# Patient Record
Sex: Male | Born: 1990 | Race: White | Hispanic: No | Smoking: Current every day smoker
Health system: Southern US, Community
[De-identification: ages and names within clinical notes are randomized; demographics above are authoritative.]

## PROBLEM LIST (undated history)

## (undated) HISTORY — PX: FRACTURE SURGERY: SHX138

---
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
## 2016-04-11 ENCOUNTER — Encounter (HOSPITAL_COMMUNITY): Payer: Self-pay | Admitting: Emergency Medicine

## 2016-04-11 ENCOUNTER — Emergency Department (HOSPITAL_COMMUNITY)
Admission: EM | Admit: 2016-04-11 | Discharge: 2016-04-11 | Disposition: A | Discharge status: Home / Self Care | Payer: Medicaid Other | Attending: Emergency Medicine | Admitting: Emergency Medicine

## 2016-04-11 DIAGNOSIS — S0501XA Injury of conjunctiva and corneal abrasion without foreign body, right eye, initial encounter: Secondary | ICD-10-CM

## 2016-04-11 DIAGNOSIS — Y999 Unspecified external cause status: Secondary | ICD-10-CM | POA: Insufficient documentation

## 2016-04-11 DIAGNOSIS — X58XXXA Exposure to other specified factors, initial encounter: Secondary | ICD-10-CM | POA: Insufficient documentation

## 2016-04-11 DIAGNOSIS — H1089 Other conjunctivitis: Secondary | ICD-10-CM | POA: Insufficient documentation

## 2016-04-11 DIAGNOSIS — Y929 Unspecified place or not applicable: Secondary | ICD-10-CM | POA: Insufficient documentation

## 2016-04-11 DIAGNOSIS — H109 Unspecified conjunctivitis: Secondary | ICD-10-CM

## 2016-04-11 DIAGNOSIS — F17210 Nicotine dependence, cigarettes, uncomplicated: Secondary | ICD-10-CM | POA: Insufficient documentation

## 2016-04-11 DIAGNOSIS — Y939 Activity, unspecified: Secondary | ICD-10-CM | POA: Insufficient documentation

## 2016-04-11 MED ORDER — POLYMYXIN B-TRIMETHOPRIM 10000-0.1 UNIT/ML-% OP SOLN: 1.0000 [drp] | OPHTHALMIC | Status: DC

## 2016-04-11 MED ORDER — TETRACAINE HCL 0.5 % OP SOLN
2.0000 [drp] | Freq: Once | OPHTHALMIC | Status: AC
  Administered 2016-04-11: 2 [drp] via OPHTHALMIC
  Filled 2016-04-11: qty 2

## 2016-04-11 MED ORDER — FLUORESCEIN SODIUM 1 MG OP STRP
1.0000 | ORAL_STRIP | Freq: Once | OPHTHALMIC | Status: AC
  Administered 2016-04-11: 1 via OPHTHALMIC
  Filled 2016-04-11: qty 1

## 2016-04-11 MED ORDER — ERYTHROMYCIN 5 MG/GM OP OINT: TOPICAL_OINTMENT | OPHTHALMIC | Status: DC

## 2016-04-11 NOTE — ED Notes (Signed)
Left eye 20/30. Right eye 20/200. PT does not attempt to identify further than 20/200 with right eye. PT states, "I don't know, it's all blurry."
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
## 2016-04-11 NOTE — Discharge Instructions (Signed)
Bacterial Conjunctivitis Bacterial conjunctivitis, commonly called pink eye, is an inflammation of the clear membrane that covers the white part of the eye (conjunctiva). The inflammation can also happen on the underside of the eyelids. The blood vessels in the conjunctiva become inflamed, causing the eye to become red or pink. Bacterial conjunctivitis may spread easily from one eye to another and from person to person (contagious).  CAUSES  Bacterial conjunctivitis is caused by bacteria. The bacteria may come from your own skin, your upper respiratory tract, or from someone else with bacterial conjunctivitis. SYMPTOMS  The normally white color of the eye or the underside of the eyelid is usually pink or red. The pink eye is usually associated with irritation, tearing, and some sensitivity to light. Bacterial conjunctivitis is often associated with a thick, yellowish discharge from the eye. The discharge may turn into a crust on the eyelids overnight, which causes your eyelids to stick together. If a discharge is present, there may also be some blurred vision in the affected eye. DIAGNOSIS  Bacterial conjunctivitis is diagnosed by your caregiver through an eye exam and the symptoms that you report. Your caregiver looks for changes in the surface tissues of your eyes, which may point to the specific type of conjunctivitis. A sample of any discharge may be collected on a cotton-tip swab if you have a severe case of conjunctivitis, if your cornea is affected, or if you keep getting repeat infections that do not respond to treatment. The sample will be sent to a lab to see if the inflammation is caused by a bacterial infection and to see if the infection will respond to antibiotic medicines. TREATMENT   Bacterial conjunctivitis is treated with antibiotics. Antibiotic eyedrops are most often used. However, antibiotic ointments are also available. Antibiotics pills are sometimes used. Artificial tears or eye  washes may ease discomfort. HOME CARE INSTRUCTIONS   To ease discomfort, apply a cool, clean washcloth to your eye for 10-20 minutes, 3-4 times a day.  Gently wipe away any drainage from your eye with a warm, wet washcloth or a cotton ball.  Wash your hands often with soap and water. Use paper towels to dry your hands.  Do not share towels or washcloths. This may spread the infection.  Change or wash your pillowcase every day.  You should not use eye makeup until the infection is gone.  Do not operate machinery or drive if your vision is blurred.  Stop using contact lenses. Ask your caregiver how to sterilize or replace your contacts before using them again. This depends on the type of contact lenses that you use.  When applying medicine to the infected eye, do not touch the edge of your eyelid with the eyedrop bottle or ointment tube. SEEK IMMEDIATE MEDICAL CARE IF:   Your infection has not improved within 3 days after beginning treatment.  You had yellow discharge from your eye and it returns.  You have increased eye pain.  Your eye redness is spreading.  Your vision becomes blurred.  You have a fever or persistent symptoms for more than 2-3 days.  You have a fever and your symptoms suddenly get worse.  You have facial pain, redness, or swelling. MAKE SURE YOU:   Understand these instructions.  Will watch your condition.  Will get help right away if you are not doing well or get worse.   This information is not intended to replace advice given to you by your health care provider. Make sure you   discuss any questions you have with your health care provider.   Document Released: 09/24/2005 Document Revised: 10/15/2014 Document Reviewed: 02/25/2012 Elsevier Interactive Patient Education 2016 [employer]  Corneal Abrasion The cornea is the clear covering at the front and center of the eye. When looking at the colored portion of the eye (iris), you are looking  through the cornea. This very thin tissue is made up of many layers. The surface layer is a single layer of cells (corneal epithelium) and is one of the most sensitive tissues in the body. If a scratch or injury causes the corneal epithelium to come off, it is called a corneal abrasion. If the injury extends to the tissues below the epithelium, the condition is called a corneal ulcer. CAUSES   Scratches.  Trauma.  Foreign body in the eye. Some people have recurrences of abrasions in the area of the original injury even after it has healed (recurrent erosion syndrome). Recurrent erosion syndrome generally improves and goes away with time. SYMPTOMS   Eye pain.  Difficulty or inability to keep the injured eye open.  The eye becomes very sensitive to light.  Recurrent erosions tend to happen suddenly, first thing in the morning, usually after waking up and opening the eye. DIAGNOSIS  Your health care provider can diagnose a corneal abrasion during an eye exam. Dye is usually placed in the eye using a drop or a small paper strip moistened by your tears. When the eye is examined with a special light, the abrasion shows up clearly because of the dye. TREATMENT   Small abrasions may be treated with antibiotic drops or ointment alone.  A pressure patch may be put over the eye. If this is done, follow your doctor's instructions for when to remove the patch. Do not drive or use machines while the eye patch is on. Judging distances is hard to do with a patch on. If the abrasion becomes infected and spreads to the deeper tissues of the cornea, a corneal ulcer can result. This is serious because it can cause corneal scarring. Corneal scars interfere with light passing through the cornea and cause a loss of vision in the involved eye. HOME CARE INSTRUCTIONS  Use medicine or ointment as directed. Only take over-the-counter or prescription medicines for pain, discomfort, or fever as directed by your  health care provider.  Do not drive or operate machinery if your eye is patched. Your ability to judge distances is impaired.  If your health care provider has given you a follow-up appointment, it is very important to keep that appointment. Not keeping the appointment could result in a severe eye infection or permanent loss of vision. If there is any problem keeping the appointment, let your health care provider know. SEEK MEDICAL CARE IF:   You have pain, light sensitivity, and a scratchy feeling in one eye or both eyes.  Your pressure patch keeps loosening up, and you can blink your eye under the patch after treatment.  Any kind of discharge develops from the eye after treatment or if the lids stick together in the morning.  You have the same symptoms in the morning as you did with the original abrasion days, weeks, or months after the abrasion healed.   This information is not intended to replace advice given to you by your health care provider. Make sure you discuss any questions you have with your health care provider.   Use antibiotics eye drops and ointment as prescribed. Follow up with ophthalmology  for re-evaluation. Return to the ED if you experience severe worsening of your symptoms change in vision, redness or swelling around your eye, increased pain, fevers or chills.

## 2021-12-16 ENCOUNTER — Other Ambulatory Visit: Payer: Self-pay

## 2021-12-16 ENCOUNTER — Emergency Department (HOSPITAL_COMMUNITY): Payer: Medicaid Other

## 2021-12-16 ENCOUNTER — Emergency Department (HOSPITAL_COMMUNITY): Payer: Medicaid Other | Admitting: Anesthesiology

## 2021-12-16 ENCOUNTER — Encounter (HOSPITAL_COMMUNITY)
Admission: EM | Disposition: A | Discharge status: Home / Self Care | Payer: Self-pay | Source: Home / Self Care | Attending: Orthopaedic Surgery

## 2021-12-16 ENCOUNTER — Encounter (HOSPITAL_COMMUNITY): Payer: Self-pay | Admitting: Emergency Medicine

## 2021-12-16 ENCOUNTER — Inpatient Hospital Stay (HOSPITAL_COMMUNITY): Payer: Medicaid Other

## 2021-12-16 ENCOUNTER — Inpatient Hospital Stay (HOSPITAL_COMMUNITY)
Admission: EM | Admit: 2021-12-16 | Discharge: 2021-12-21 | DRG: 488 | Disposition: A | Discharge status: Home / Self Care | Payer: Medicaid Other | Attending: Orthopaedic Surgery | Admitting: Orthopaedic Surgery

## 2021-12-16 DIAGNOSIS — Z880 Allergy status to penicillin: Secondary | ICD-10-CM | POA: Diagnosis not present

## 2021-12-16 DIAGNOSIS — S42491A Other displaced fracture of lower end of right humerus, initial encounter for closed fracture: Secondary | ICD-10-CM | POA: Diagnosis not present

## 2021-12-16 DIAGNOSIS — S42351A Displaced comminuted fracture of shaft of humerus, right arm, initial encounter for closed fracture: Principal | ICD-10-CM | POA: Diagnosis present

## 2021-12-16 DIAGNOSIS — S82141A Displaced bicondylar fracture of right tibia, initial encounter for closed fracture: Secondary | ICD-10-CM | POA: Diagnosis present

## 2021-12-16 DIAGNOSIS — S83261A Peripheral tear of lateral meniscus, current injury, right knee, initial encounter: Secondary | ICD-10-CM

## 2021-12-16 DIAGNOSIS — T1490XA Injury, unspecified, initial encounter: Secondary | ICD-10-CM | POA: Diagnosis present

## 2021-12-16 DIAGNOSIS — Z419 Encounter for procedure for purposes other than remedying health state, unspecified: Secondary | ICD-10-CM

## 2021-12-16 DIAGNOSIS — S82201A Unspecified fracture of shaft of right tibia, initial encounter for closed fracture: Secondary | ICD-10-CM | POA: Diagnosis present

## 2021-12-16 DIAGNOSIS — S42401A Unspecified fracture of lower end of right humerus, initial encounter for closed fracture: Secondary | ICD-10-CM

## 2021-12-16 DIAGNOSIS — Y92410 Unspecified street and highway as the place of occurrence of the external cause: Secondary | ICD-10-CM

## 2021-12-16 DIAGNOSIS — F17210 Nicotine dependence, cigarettes, uncomplicated: Secondary | ICD-10-CM | POA: Diagnosis present

## 2021-12-16 DIAGNOSIS — Z23 Encounter for immunization: Secondary | ICD-10-CM

## 2021-12-16 DIAGNOSIS — D62 Acute posthemorrhagic anemia: Secondary | ICD-10-CM | POA: Diagnosis not present

## 2021-12-16 DIAGNOSIS — Z20822 Contact with and (suspected) exposure to covid-19: Secondary | ICD-10-CM | POA: Diagnosis present

## 2021-12-16 DIAGNOSIS — Z09 Encounter for follow-up examination after completed treatment for conditions other than malignant neoplasm: Secondary | ICD-10-CM

## 2021-12-16 HISTORY — PX: ORIF HUMERUS FRACTURE: SHX2126

## 2021-12-16 HISTORY — PX: EXTERNAL FIXATION LEG: SHX1549

## 2021-12-16 LAB — I-STAT CHEM 8, ED
BUN: 15 mg/dL (ref 6–20)
Calcium, Ion: 1.12 mmol/L — ABNORMAL LOW (ref 1.15–1.40)
Chloride: 102 mmol/L (ref 98–111)
Creatinine, Ser: 1.1 mg/dL (ref 0.61–1.24)
Glucose, Bld: 101 mg/dL — ABNORMAL HIGH (ref 70–99)
HCT: 41 % (ref 39.0–52.0)
Hemoglobin: 13.9 g/dL (ref 13.0–17.0)
Potassium: 3.7 mmol/L (ref 3.5–5.1)
Sodium: 139 mmol/L (ref 135–145)
TCO2: 28 mmol/L (ref 22–32)

## 2021-12-16 LAB — SAMPLE TO BLOOD BANK

## 2021-12-16 LAB — CBC
HCT: 42.5 % (ref 39.0–52.0)
Hemoglobin: 14.3 g/dL (ref 13.0–17.0)
MCH: 28.7 pg (ref 26.0–34.0)
MCHC: 33.6 g/dL (ref 30.0–36.0)
MCV: 85.3 fL (ref 80.0–100.0)
Platelets: 305 10*3/uL (ref 150–400)
RBC: 4.98 MIL/uL (ref 4.22–5.81)
RDW: 11.9 % (ref 11.5–15.5)
WBC: 8.8 10*3/uL (ref 4.0–10.5)
nRBC: 0 % (ref 0.0–0.2)

## 2021-12-16 LAB — COMPREHENSIVE METABOLIC PANEL
ALT: 16 U/L (ref 0–44)
AST: 22 U/L (ref 15–41)
Albumin: 3.9 g/dL (ref 3.5–5.0)
Alkaline Phosphatase: 70 U/L (ref 38–126)
Anion gap: 10 (ref 5–15)
BUN: 13 mg/dL (ref 6–20)
CO2: 25 mmol/L (ref 22–32)
Calcium: 9.2 mg/dL (ref 8.9–10.3)
Chloride: 102 mmol/L (ref 98–111)
Creatinine, Ser: 1.17 mg/dL (ref 0.61–1.24)
GFR, Estimated: >60 mL/min (ref >60)
Glucose, Bld: 107 mg/dL — ABNORMAL HIGH (ref 70–99)
Potassium: 3.7 mmol/L (ref 3.5–5.1)
Sodium: 137 mmol/L (ref 135–145)
Total Bilirubin: 0.5 mg/dL (ref 0.3–1.2)
Total Protein: 6.8 g/dL (ref 6.5–8.1)

## 2021-12-16 LAB — RESP PANEL BY RT-PCR (FLU A&B, COVID) ARPGX2
Influenza A by PCR: NEGATIVE
Influenza B by PCR: NEGATIVE
SARS Coronavirus 2 by RT PCR: NEGATIVE

## 2021-12-16 LAB — LACTIC ACID, PLASMA: Lactic Acid, Venous: 2.6 mmol/L (ref 0.5–1.9)

## 2021-12-16 LAB — SURGICAL PCR SCREEN
MRSA, PCR: POSITIVE — AB
Staphylococcus aureus: POSITIVE — AB

## 2021-12-16 LAB — ETHANOL: Alcohol, Ethyl (B): <10 mg/dL (ref <10)

## 2021-12-16 SURGERY — OPEN REDUCTION INTERNAL FIXATION (ORIF) DISTAL HUMERUS FRACTURE
Anesthesia: General | Laterality: Right

## 2021-12-16 MED ORDER — ONDANSETRON HCL 4 MG/2ML IJ SOLN
INTRAMUSCULAR | Status: AC
  Filled 2021-12-16: qty 2

## 2021-12-16 MED ORDER — FENTANYL CITRATE (PF) 250 MCG/5ML IJ SOLN
INTRAMUSCULAR | Status: DC | PRN
  Administered 2021-12-16 (×5): 50 ug via INTRAVENOUS

## 2021-12-16 MED ORDER — SODIUM CHLORIDE 0.9 % IV SOLN: INTRAVENOUS | Status: DC

## 2021-12-16 MED ORDER — ROCURONIUM BROMIDE 10 MG/ML (PF) SYRINGE
PREFILLED_SYRINGE | INTRAVENOUS | Status: DC | PRN
Start: 2021-12-16 — End: 2021-12-16
  Administered 2021-12-16: 100 mg via INTRAVENOUS
  Administered 2021-12-16: 20 mg via INTRAVENOUS

## 2021-12-16 MED ORDER — TRANEXAMIC ACID-NACL 1000-0.7 MG/100ML-% IV SOLN
INTRAVENOUS | Status: AC
  Filled 2021-12-16: qty 100

## 2021-12-16 MED ORDER — IOHEXOL 350 MG/ML SOLN
100.0000 mL | Freq: Once | INTRAVENOUS | Status: AC | PRN
  Administered 2021-12-16: 100 mL via INTRAVENOUS

## 2021-12-16 MED ORDER — PROPOFOL 10 MG/ML IV BOLUS
INTRAVENOUS | Status: AC
  Filled 2021-12-16: qty 20

## 2021-12-16 MED ORDER — DEXMEDETOMIDINE (PRECEDEX) IN NS 20 MCG/5ML (4 MCG/ML) IV SYRINGE
PREFILLED_SYRINGE | INTRAVENOUS | Status: AC
  Filled 2021-12-16: qty 5

## 2021-12-16 MED ORDER — CEFAZOLIN SODIUM-DEXTROSE 2-4 GM/100ML-% IV SOLN
2.0000 g | INTRAVENOUS | Status: AC
  Administered 2021-12-16: 2 g via INTRAVENOUS

## 2021-12-16 MED ORDER — LACTATED RINGERS IV SOLN: INTRAVENOUS | Status: DC

## 2021-12-16 MED ORDER — MIDAZOLAM HCL 5 MG/5ML IJ SOLN
INTRAMUSCULAR | Status: DC | PRN
  Administered 2021-12-16: 2 mg via INTRAVENOUS

## 2021-12-16 MED ORDER — SODIUM CHLORIDE 0.9 % IV BOLUS
1000.0000 mL | Freq: Once | INTRAVENOUS | Status: AC
  Administered 2021-12-16: 1000 mL via INTRAVENOUS

## 2021-12-16 MED ORDER — ORAL CARE MOUTH RINSE: 15.0000 mL | Freq: Once | OROMUCOSAL | Status: AC

## 2021-12-16 MED ORDER — ROCURONIUM BROMIDE 10 MG/ML (PF) SYRINGE
PREFILLED_SYRINGE | INTRAVENOUS | Status: AC
  Filled 2021-12-16: qty 10

## 2021-12-16 MED ORDER — LIDOCAINE 2% (20 MG/ML) 5 ML SYRINGE
INTRAMUSCULAR | Status: DC | PRN
  Administered 2021-12-16: 60 mg via INTRAVENOUS

## 2021-12-16 MED ORDER — MORPHINE SULFATE (PF) 4 MG/ML IV SOLN
INTRAVENOUS | Status: AC
  Filled 2021-12-16: qty 1

## 2021-12-16 MED ORDER — DEXAMETHASONE SODIUM PHOSPHATE 10 MG/ML IJ SOLN
INTRAMUSCULAR | Status: AC
  Filled 2021-12-16: qty 1

## 2021-12-16 MED ORDER — SUCCINYLCHOLINE CHLORIDE 200 MG/10ML IV SOSY
PREFILLED_SYRINGE | INTRAVENOUS | Status: DC | PRN
  Administered 2021-12-16: 120 mg via INTRAVENOUS

## 2021-12-16 MED ORDER — OXYCODONE HCL 5 MG PO TABS: 5.0000 mg | ORAL_TABLET | ORAL | Status: DC | PRN

## 2021-12-16 MED ORDER — ACETAMINOPHEN 500 MG PO TABS
1000.0000 mg | ORAL_TABLET | Freq: Four times a day (QID) | ORAL | Status: AC
  Administered 2021-12-17 (×3): 1000 mg via ORAL
  Filled 2021-12-16 (×4): qty 2

## 2021-12-16 MED ORDER — VANCOMYCIN HCL 1000 MG IV SOLR
INTRAVENOUS | Status: DC | PRN
  Administered 2021-12-16: 1000 mg

## 2021-12-16 MED ORDER — MIDAZOLAM HCL 2 MG/2ML IJ SOLN
INTRAMUSCULAR | Status: AC
  Filled 2021-12-16: qty 2

## 2021-12-16 MED ORDER — CHLORHEXIDINE GLUCONATE 4 % EX LIQD: 60.0000 mL | Freq: Once | CUTANEOUS | Status: DC

## 2021-12-16 MED ORDER — TETANUS-DIPHTH-ACELL PERTUSSIS 5-2.5-18.5 LF-MCG/0.5 IM SUSY
0.5000 mL | PREFILLED_SYRINGE | Freq: Once | INTRAMUSCULAR | Status: AC
  Administered 2021-12-16: 0.5 mL via INTRAMUSCULAR
  Filled 2021-12-16: qty 0.5

## 2021-12-16 MED ORDER — OXYCODONE HCL 5 MG PO TABS
10.0000 mg | ORAL_TABLET | ORAL | Status: DC | PRN
  Administered 2021-12-17 – 2021-12-18 (×6): 10 mg via ORAL
  Filled 2021-12-16 (×8): qty 2

## 2021-12-16 MED ORDER — ONDANSETRON HCL 4 MG/2ML IJ SOLN
4.0000 mg | Freq: Once | INTRAMUSCULAR | Status: AC
  Administered 2021-12-16: 4 mg via INTRAVENOUS
  Filled 2021-12-16: qty 2

## 2021-12-16 MED ORDER — HYDROMORPHONE HCL 1 MG/ML IJ SOLN: 0.2500 mg | INTRAMUSCULAR | Status: DC | PRN

## 2021-12-16 MED ORDER — ACETAMINOPHEN 10 MG/ML IV SOLN
INTRAVENOUS | Status: DC | PRN
  Administered 2021-12-16: 1000 mg via INTRAVENOUS

## 2021-12-16 MED ORDER — SODIUM CHLORIDE 0.9 % IR SOLN
Status: DC | PRN
  Administered 2021-12-16: 3000 mL

## 2021-12-16 MED ORDER — PROPOFOL 10 MG/ML IV BOLUS
INTRAVENOUS | Status: DC | PRN
  Administered 2021-12-16: 140 mg via INTRAVENOUS

## 2021-12-16 MED ORDER — CEFAZOLIN SODIUM 1 G IJ SOLR
INTRAMUSCULAR | Status: AC
  Filled 2021-12-16: qty 20

## 2021-12-16 MED ORDER — MORPHINE SULFATE (PF) 4 MG/ML IV SOLN
4.0000 mg | Freq: Once | INTRAVENOUS | Status: AC
  Administered 2021-12-16: 4 mg via INTRAVENOUS
  Filled 2021-12-16: qty 1

## 2021-12-16 MED ORDER — ONDANSETRON HCL 4 MG PO TABS: 4.0000 mg | ORAL_TABLET | Freq: Four times a day (QID) | ORAL | Status: DC | PRN

## 2021-12-16 MED ORDER — CEFAZOLIN SODIUM-DEXTROSE 2-4 GM/100ML-% IV SOLN
INTRAVENOUS | Status: AC
  Filled 2021-12-16: qty 100

## 2021-12-16 MED ORDER — ONDANSETRON HCL 4 MG/2ML IJ SOLN: 4.0000 mg | Freq: Four times a day (QID) | INTRAMUSCULAR | Status: DC | PRN

## 2021-12-16 MED ORDER — ACETAMINOPHEN 10 MG/ML IV SOLN
INTRAVENOUS | Status: AC
  Filled 2021-12-16: qty 100

## 2021-12-16 MED ORDER — SUGAMMADEX SODIUM 200 MG/2ML IV SOLN
INTRAVENOUS | Status: DC | PRN
  Administered 2021-12-16: 200 mg via INTRAVENOUS
  Administered 2021-12-16: 100 mg via INTRAVENOUS

## 2021-12-16 MED ORDER — 0.9 % SODIUM CHLORIDE (POUR BTL) OPTIME
TOPICAL | Status: DC | PRN
  Administered 2021-12-16: 1000 mL

## 2021-12-16 MED ORDER — TRANEXAMIC ACID-NACL 1000-0.7 MG/100ML-% IV SOLN
1000.0000 mg | INTRAVENOUS | Status: AC
  Administered 2021-12-16: 1000 mg via INTRAVENOUS

## 2021-12-16 MED ORDER — ONDANSETRON HCL 4 MG/2ML IJ SOLN
INTRAMUSCULAR | Status: DC | PRN
  Administered 2021-12-16: 4 mg via INTRAVENOUS

## 2021-12-16 MED ORDER — ENOXAPARIN SODIUM 40 MG/0.4ML IJ SOSY
40.0000 mg | PREFILLED_SYRINGE | INTRAMUSCULAR | Status: DC
  Administered 2021-12-16: 40 mg via SUBCUTANEOUS
  Filled 2021-12-16 (×2): qty 0.4

## 2021-12-16 MED ORDER — CHLORHEXIDINE GLUCONATE 0.12 % MT SOLN: 15.0000 mL | Freq: Once | OROMUCOSAL | Status: AC

## 2021-12-16 MED ORDER — LIDOCAINE 2% (20 MG/ML) 5 ML SYRINGE
INTRAMUSCULAR | Status: AC
  Filled 2021-12-16: qty 5

## 2021-12-16 MED ORDER — VANCOMYCIN HCL 1000 MG IV SOLR
INTRAVENOUS | Status: AC
  Filled 2021-12-16: qty 20

## 2021-12-16 MED ORDER — HYDROMORPHONE HCL 1 MG/ML IJ SOLN
0.5000 mg | INTRAMUSCULAR | Status: DC | PRN
  Administered 2021-12-17 – 2021-12-20 (×12): 1 mg via INTRAVENOUS
  Filled 2021-12-16 (×13): qty 1

## 2021-12-16 MED ORDER — FENTANYL CITRATE (PF) 250 MCG/5ML IJ SOLN
INTRAMUSCULAR | Status: AC
  Filled 2021-12-16: qty 5

## 2021-12-16 MED ORDER — DEXAMETHASONE SODIUM PHOSPHATE 10 MG/ML IJ SOLN
INTRAMUSCULAR | Status: DC | PRN
  Administered 2021-12-16: 10 mg via INTRAVENOUS

## 2021-12-16 MED ORDER — DOCUSATE SODIUM 100 MG PO CAPS
100.0000 mg | ORAL_CAPSULE | Freq: Two times a day (BID) | ORAL | Status: DC
  Administered 2021-12-16 – 2021-12-21 (×10): 100 mg via ORAL
  Filled 2021-12-16 (×10): qty 1

## 2021-12-16 MED ORDER — DEXMEDETOMIDINE (PRECEDEX) IN NS 20 MCG/5ML (4 MCG/ML) IV SYRINGE
PREFILLED_SYRINGE | INTRAVENOUS | Status: DC | PRN
  Administered 2021-12-16: 8 ug via INTRAVENOUS

## 2021-12-16 MED ORDER — MORPHINE SULFATE (PF) 4 MG/ML IV SOLN
4.0000 mg | Freq: Once | INTRAVENOUS | Status: AC
  Administered 2021-12-16: 4 mg via INTRAVENOUS

## 2021-12-16 MED ORDER — CHLORHEXIDINE GLUCONATE 0.12 % MT SOLN
OROMUCOSAL | Status: AC
  Administered 2021-12-16: 15 mL via OROMUCOSAL
  Filled 2021-12-16: qty 15

## 2021-12-16 SURGICAL SUPPLY — 103 items
BAG COUNTER SPONGE SURGICOUNT (BAG) ×3 IMPLANT
BAG SPNG CNTER NS LX DISP (BAG) ×1
BAG SURGICOUNT SPONGE COUNTING (BAG) ×1
BANDAGE ESMARK 6X9 LF (GAUZE/BANDAGES/DRESSINGS) ×2 IMPLANT
BAR EXFX 150X11 NS LF (EXFIX) ×1
BAR EXFX 300X11 NS LF (EXFIX) ×2
BAR EXFX 350X11 NS LF (EXFIX) ×1
BAR GLASS FIBER EXFX 11X150 (EXFIX) ×2 IMPLANT
BAR GLASS FIBER EXFX 11X300 (EXFIX) ×4 IMPLANT
BAR GLASS FIBER EXFX 11X350 (EXFIX) ×2 IMPLANT
BIT DRILL 2.5X2.75 QC CALB (BIT) ×2 IMPLANT
BIT DRILL 3.5X5.5 QC CALB (BIT) ×2 IMPLANT
BLADE AVERAGE 25MMX9MM (BLADE)
BLADE AVERAGE 25X9 (BLADE) IMPLANT
BNDG CMPR 9X6 STRL LF SNTH (GAUZE/BANDAGES/DRESSINGS) ×1
BNDG COHESIVE 4X5 TAN STRL (GAUZE/BANDAGES/DRESSINGS) ×4 IMPLANT
BNDG COHESIVE 6X5 TAN STRL LF (GAUZE/BANDAGES/DRESSINGS) IMPLANT
BNDG ELASTIC 4X5.8 VLCR STR LF (GAUZE/BANDAGES/DRESSINGS) ×4 IMPLANT
BNDG ELASTIC 6X5.8 VLCR STR LF (GAUZE/BANDAGES/DRESSINGS) ×4 IMPLANT
BNDG ESMARK 6X9 LF (GAUZE/BANDAGES/DRESSINGS) ×3
BNDG GAUZE ELAST 4 BULKY (GAUZE/BANDAGES/DRESSINGS) ×6 IMPLANT
BRUSH SCRUB EZ PLAIN DRY (MISCELLANEOUS) ×8 IMPLANT
CLAMP BLUE BAR TO BAR (EXFIX) ×6 IMPLANT
CLAMP BLUE BAR TO PIN (EXFIX) ×4 IMPLANT
CLOSURE WOUND 1/2 X4 (GAUZE/BANDAGES/DRESSINGS)
CORD BIPOLAR FORCEPS 12FT (ELECTRODE) IMPLANT
COVER SURGICAL LIGHT HANDLE (MISCELLANEOUS) ×8 IMPLANT
CUFF TOURN SGL QUICK 18X4 (TOURNIQUET CUFF) IMPLANT
DRAIN PENROSE 1/4X12 LTX STRL (WOUND CARE) IMPLANT
DRAPE C-ARM 42X72 X-RAY (DRAPES) ×4 IMPLANT
DRAPE HALF SHEET 40X57 (DRAPES) ×2 IMPLANT
DRAPE IMP U-DRAPE 54X76 (DRAPES) ×4 IMPLANT
DRAPE INCISE IOBAN 66X45 STRL (DRAPES) ×2 IMPLANT
DRAPE ORTHO SPLIT 77X108 STRL (DRAPES) ×3
DRAPE SURG ORHT 6 SPLT 77X108 (DRAPES) ×2 IMPLANT
DRAPE U-SHAPE 47X51 STRL (DRAPES) ×8 IMPLANT
DRSG ADAPTIC 3X8 NADH LF (GAUZE/BANDAGES/DRESSINGS) ×4 IMPLANT
DRSG MEPITEL 4X7.2 (GAUZE/BANDAGES/DRESSINGS) ×4 IMPLANT
DRSG PAD ABDOMINAL 8X10 ST (GAUZE/BANDAGES/DRESSINGS) ×4 IMPLANT
ELECT REM PT RETURN 9FT ADLT (ELECTROSURGICAL) ×3
ELECTRODE REM PT RTRN 9FT ADLT (ELECTROSURGICAL) ×2 IMPLANT
EVACUATOR 1/8 PVC DRAIN (DRAIN) IMPLANT
GAUZE SPONGE 4X4 12PLY STRL (GAUZE/BANDAGES/DRESSINGS) ×6 IMPLANT
GAUZE XEROFORM 5X9 LF (GAUZE/BANDAGES/DRESSINGS) ×2 IMPLANT
GLOVE SRG 8 PF TXTR STRL LF DI (GLOVE) ×2 IMPLANT
GLOVE SURG ENC MOIS LTX SZ8 (GLOVE) ×4 IMPLANT
GLOVE SURG ORTHO LTX SZ7.5 (GLOVE) ×8 IMPLANT
GLOVE SURG UNDER POLY LF SZ7.5 (GLOVE) ×4 IMPLANT
GLOVE SURG UNDER POLY LF SZ8 (GLOVE) ×3
GOWN STRL REUS W/ TWL LRG LVL3 (GOWN DISPOSABLE) ×4 IMPLANT
GOWN STRL REUS W/ TWL XL LVL3 (GOWN DISPOSABLE) ×4 IMPLANT
GOWN STRL REUS W/TWL LRG LVL3 (GOWN DISPOSABLE) ×6
GOWN STRL REUS W/TWL XL LVL3 (GOWN DISPOSABLE) ×6
K-WIRE FIXATION 2.0X6 (WIRE) ×3
KIT BASIN OR (CUSTOM PROCEDURE TRAY) ×4 IMPLANT
KIT TURNOVER KIT B (KITS) ×4 IMPLANT
KWIRE FIXATION 2.0X6 (WIRE) IMPLANT
MANIFOLD NEPTUNE II (INSTRUMENTS) ×4 IMPLANT
NDL HYPO 25X1 1.5 SAFETY (NEEDLE) IMPLANT
NEEDLE 22X1 1/2 (OR ONLY) (NEEDLE) IMPLANT
NEEDLE HYPO 25X1 1.5 SAFETY (NEEDLE) IMPLANT
NS IRRIG 1000ML POUR BTL (IV SOLUTION) ×4 IMPLANT
PACK ORTHO EXTREMITY (CUSTOM PROCEDURE TRAY) ×4 IMPLANT
PAD ARMBOARD 7.5X6 YLW CONV (MISCELLANEOUS) ×8 IMPLANT
PADDING CAST COTTON 6X4 STRL (CAST SUPPLIES) ×8 IMPLANT
PIN CLAMP 2BAR 75MM BLUE (EXFIX) ×2 IMPLANT
PIN HALF ORANGE 5X200X45MM (EXFIX) ×4 IMPLANT
PIN HALF YELLOW 5X160X35 (EXFIX) ×4 IMPLANT
PLATE HUMERUS 21H (Plate) ×2 IMPLANT
SCREW CORT 3.5X26 (Screw) ×3 IMPLANT
SCREW CORT 3.5X32 (Screw) ×6 IMPLANT
SCREW CORT FT 32X3.5XNONLOCK (Screw) IMPLANT
SCREW CORT T15 24X3.5XST LCK (Screw) IMPLANT
SCREW CORT T15 26X3.5XST LCK (Screw) IMPLANT
SCREW CORT T15 32X3.5XST LCK (Screw) IMPLANT
SCREW CORTICAL 3.5MM  28MM (Screw) ×9 IMPLANT
SCREW CORTICAL 3.5MM  30MM (Screw) ×3 IMPLANT
SCREW CORTICAL 3.5MM  32MM (Screw) ×12 IMPLANT
SCREW CORTICAL 3.5MM 28MM (Screw) IMPLANT
SCREW CORTICAL 3.5MM 30MM (Screw) IMPLANT
SCREW CORTICAL 3.5MM 32MM (Screw) ×4 IMPLANT
SCREW CORTICAL 3.5X24MM (Screw) ×3 IMPLANT
SET CYSTO W/LG BORE CLAMP LF (SET/KITS/TRAYS/PACK) ×2 IMPLANT
SPONGE T-LAP 18X18 ~~LOC~~+RFID (SPONGE) ×6 IMPLANT
STAPLER VISISTAT 35W (STAPLE) ×4 IMPLANT
STOCKINETTE IMPERVIOUS 9X36 MD (GAUZE/BANDAGES/DRESSINGS) IMPLANT
STOCKINETTE IMPERVIOUS LG (DRAPES) IMPLANT
STRIP CLOSURE SKIN 1/2X4 (GAUZE/BANDAGES/DRESSINGS) IMPLANT
SUCTION FRAZIER HANDLE 10FR (MISCELLANEOUS) ×3
SUCTION TUBE FRAZIER 10FR DISP (MISCELLANEOUS) ×2 IMPLANT
SUT ETHILON 2 0 FS 18 (SUTURE) ×8 IMPLANT
SUT VIC AB 0 CT1 27 (SUTURE) ×6
SUT VIC AB 0 CT1 27XBRD ANBCTR (SUTURE) ×4 IMPLANT
SUT VIC AB 2-0 CT1 27 (SUTURE) ×9
SUT VIC AB 2-0 CT1 TAPERPNT 27 (SUTURE) ×4 IMPLANT
SYR 5ML LL (SYRINGE) IMPLANT
SYR CONTROL 10ML LL (SYRINGE) IMPLANT
TOWEL GREEN STERILE (TOWEL DISPOSABLE) ×12 IMPLANT
TOWEL GREEN STERILE FF (TOWEL DISPOSABLE) ×8 IMPLANT
TRAY FOLEY MTR SLVR 16FR STAT (SET/KITS/TRAYS/PACK) IMPLANT
UNDERPAD 30X36 HEAVY ABSORB (UNDERPADS AND DIAPERS) ×4 IMPLANT
WATER STERILE IRR 1000ML POUR (IV SOLUTION) ×4 IMPLANT
YANKAUER SUCT BULB TIP NO VENT (SUCTIONS) IMPLANT
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
## 2021-12-16 NOTE — Op Note (Signed)
? ?Date of Surgery: 12/16/2021 ? ?INDICATIONS: Michael Juarez is a 31 y.o.-year-old male with a displaced angulated distal third humeral shaft fracture on the right as well as a right knee tibial plateau fracture with significant depression and valgus angulation.  The risk and benefits of the procedure with discussed in detail and documented in the pre-operative evaluation. ? ?PREOPERATIVE DIAGNOSIS: 1.  Right distal third humeral shaft fracture closed ?2.  Right closed split depressed lateral tibial plateau fracture ? ? ?POSTOPERATIVE DIAGNOSIS: Same. ? ?PROCEDURE: 1.  Open reduction internal fixation right distal humeral shaft ?2.  Radial nerve neurolysis ?3.  Ulnar nerve neurolysis ?4.  Multiplanar knee spanning external fixator application to right knee ? ?SURGEON: Benancio Deeds MD ? ?ASSISTANT: Doy Hutching ? ?ANESTHESIA:  general ? ?IV FLUIDS AND URINE: See anesthesia record. ? ?ANTIBIOTICS: Ancef 2 g ? ?ESTIMATED BLOOD LOSS: 100 mL. ? ?IMPLANTS:  ?Implant Name Type Inv. Item Serial No. Manufacturer Lot No. LRB No. Used Action  ?PLATE HUMERUS 16X - [id number] Plate PLATE HUMERUS 81X  [employer](ORTH,TRAU,[employer],SG)  Right 1 Implanted  ?SCREW CORTICAL 3.5X24MM - [id number] Screw SCREW CORTICAL 3.5X24MM  [employer](ORTH,TRAU,BIO,SG)  Right 1 Implanted  ?SCREW CORT 3.5X26 - [id number] Screw SCREW CORT 3.5X26  [employer](ORTH,TRAU,BIO,SG)  Right 1 Implanted  ?SCREW CORT 3.5X32 - [id number] Screw SCREW CORT 3.5X32  [employer](ORTH,TRAU,BIO,SG)  Right 2 Implanted  ?SCREW CORTICAL 3.5MM  - [id number] Screw SCREW CORTICAL 3.5MM   [employer](ORTH,TRAU,BIO,SG)  Right 3 Implanted  ?SCREW CORTICAL 3.5MM  - [id number] Screw SCREW CORTICAL 3.5MM   [employer](ORTH,TRAU,BIO,SG)  Right 1 Implanted  ?SCREW CORTICAL 3.5MM  - [id number] Screw SCREW CORTICAL 3.5MM   [employer](ORTH,TRAU,BIO,SG)  Right 3 Implanted  ? ? ?DRAINS: None ? ?CULTURES: None ? ?COMPLICATIONS: none ? ?DESCRIPTION OF  PROCEDURE:  ?Patient was identified in preoperative holding area.  The correct site was marked according universal protocol with nursing.  He is subsequently taken back to the operating room.  Anesthesia was induced.  He was prepped and draped in the usual sterile fashion.  Ancef was given 1 hour prior to skin incision.  Again final timeout was performed.  We began with the humerus.  He was placed in a lateral position with care to pad all bony prominences.  A direct posterior approach was made to the humerus.  15 blade was used to incise through skin.  Metzenbaum scissors and Adson's were used to dissect layer by layer down to the triceps fascia.  A para tricipital window was utilized initially laterally and the fracture was identified laterally.  The superficial sensory branch of the radial nerve was identified and this was carried and dissected proximally again with Metzenbaum scissors with care to handle around the nerve.  The radial nerve was identified.  Attention was then turned to the medial para tricipital window.  The ulnar nerve was identified as it appears the intermuscular septum and into the cubital tunnel.  Again Metzenbaum scissors were used to meticulously dissect this out to ensure that this was safe during the entirety of the procedure.  Once the exposure was adequately visualized a lag screw was placed in the distal and proximal fragments.  This realigned the humerus with excellent alignment in the coronal and sagittal plane.  2 additional x-rays were placed into the butterfly fragment.  At this time a neutralization type plate was then utilized specifically on extra-articular posterior lateral plate over the humerus.  This was again  slid under the radial nerve very carefully with care to protect the radial nerve during the entire time.  The plate position was confirmed on the AP and lateral fluoroscopy and provisionally pinned into place.  We then proceeded with screw placement proximally and  distally.  3 screws were placed proximal to the fracture site.  Again retraction of the nerve was done sparingly in order to not irritate it.  Distally 5 screws were placed.  Following this we are very happy with the quality of the reduction. ? ?The wound was thoroughly irrigated with a liter of normal saline.  Vancomycin powder was placed.  The wound was closed in layers of 0 Vicryl 2-0 Vicryl and staples.  A soft dressing with Xeroform gauze web roll and an Ace wrap was applied.  There were no complications counts were correct at the end of this portion of the case. ? ?Attention was then turned to the right knee.  This was reprepped and draped in the usual fashion.  We began with our femoral pins.  Direct lateral pins were placed again using fluoroscopy in order to gauge the spread and location of the pins in the distal femur.  15 blade was used to incise through skin and IT band.  Staff was used to spread down to bone.  The pin was then placed under direct fluoroscopic visualization with care to become bicortical.  This was done 1 additional time in the femur.  A similar technique was used in the distal tibia with care to be outside of the zone of injury.  The drill guides were placed as a regular and 15 blade was used to incise through skin in both locations.  The pins were again placed under direct fluoroscopic visualization with care to be bicortical.  At this time the bars were then constructed to the pins and the leg was held into traction.  This provided improved reduction of the previous valgus deformity.  The wounds were thoroughly irrigated and pin dressings were placed with normal saline moistened Kerlix. ? ?All counts were correct at the end of the case.  There are no complications the patient was taken to the PACU. ? ? ? ?POSTOPERATIVE PLAN: The patient will be nonweightbearing on the right lower extremity.  He will begin physical therapy for gentle range of motion about the right elbow and right  wrist.  He will be admitted to the hospital for physical therapy in a staged fixation of his right tibial plateau fracture ? ?Benancio Deeds, MD ?4:03 PM ? ? ? ?

## 2021-12-16 NOTE — Anesthesia Postprocedure Evaluation (Signed)
Anesthesia Post Note ? ?Patient: Michael Juarez ? ?Procedure(s) Performed: OPEN REDUCTION INTERNAL FIXATION (ORIF) DISTAL HUMERUS FRACTURE (Right) ?EXTERNAL FIXATION KNEE (Right) ? ?  ? ?Patient location during evaluation: PACU ?Anesthesia Type: General ?Level of consciousness: awake and alert ?Pain management: pain level controlled ?Vital Signs Assessment: post-procedure vital signs reviewed and stable ?Respiratory status: spontaneous breathing, nonlabored ventilation and respiratory function stable ?Cardiovascular status: blood pressure returned to baseline and stable ?Postop Assessment: no apparent nausea or vomiting ?Anesthetic complications: no ? ? ?No notable events documented. ? ?Last Vitals:  ?Vitals:  ? 12/16/21 1635 12/16/21 1808  ?BP: (!) 133/93 132/79  ?Pulse: 74 80  ?Resp: 18 18  ?Temp: (!) 36.2 ?C 36.8 ?C  ?SpO2: 100% 96%  ?  ?Last Pain:  ?Vitals:  ? 12/16/21 1635  ?TempSrc:   ?PainSc: 0-No pain  ? ? ?  ?  ?  ?  ?  ?  ? ?Wende Longstreth,W. EDMOND ? ? ? ? ?

## 2021-12-16 NOTE — Interval H&P Note (Signed)
History and Physical Interval Note: ? ?12/16/2021 ?11:49 AM ? ?Michael Juarez  has presented today for surgery, with the diagnosis of Right Humerus Fx, Right Knee Fx.  The various methods of treatment have been discussed with the patient and family. After consideration of risks, benefits and other options for treatment, the patient has consented to  Procedure(s): ?OPEN REDUCTION INTERNAL FIXATION (ORIF) DISTAL HUMERUS FRACTURE (Right) ?EXTERNAL FIXATION KNEE (Right) as a surgical intervention.  The patient's history has been reviewed, patient examined, no change in status, stable for surgery.  I have reviewed the patient's chart and labs.  Questions were answered to the patient's satisfaction.   ? ? ?Michael Juarez ? ? ?

## 2021-12-16 NOTE — Progress Notes (Signed)
Chaplain escorted pt's father to Trauma B upon arrival.  Chaplain escorted pt's wife from [facility] ED to pt's bedside.  Chaplain offered ministry of presence with pt's father as he told of the tragic incidents in his life which have made him no stranger to hospital ED's.  Chaplain available for additional support if needed. ? ?Vernell Morgans ?Chaplain ?
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
## 2021-12-16 NOTE — Anesthesia Preprocedure Evaluation (Addendum)
Anesthesia Evaluation  ?Patient identified by MRN, date of birth, ID band ?Patient awake ? ? ? ?Reviewed: ?Allergy & Precautions, H&P , NPO status , Patient's Chart, lab work & pertinent test results ? ?Airway ?Mallampati: II ? ?TM Distance: >3 FB ?Neck ROM: Full ? ? ? Dental ?no notable dental hx. ?(+) Teeth Intact, Dental Advisory Given ?  ?Pulmonary ?Current SmokerPatient did not abstain from smoking.,  ?  ?Pulmonary exam normal ?breath sounds clear to auscultation ? ? ? ? ? ? Cardiovascular ?negative cardio ROS ? ? ?Rhythm:Regular Rate:Normal ? ? ?  ?Neuro/Psych ?negative neurological ROS ? negative psych ROS  ? GI/Hepatic ?negative GI ROS, Neg liver ROS,   ?Endo/Other  ?negative endocrine ROS ? Renal/GU ?negative Renal ROS  ?negative genitourinary ?  ?Musculoskeletal ? ? Abdominal ?  ?Peds ? Hematology ?negative hematology ROS ?(+)   ?Anesthesia Other Findings ? ? Reproductive/Obstetrics ?negative OB ROS ? ?  ? ? ? ? ? ? ? ? ? ? ? ? ? ?  ?  ? ? ? ? ? ? ?Anesthesia Physical ?Anesthesia Plan ? ?ASA: 1 and emergent ? ?Anesthesia Plan: General  ? ?Post-op Pain Management: Ofirmev IV (intra-op)*  ? ?Induction: Intravenous, Rapid sequence and Cricoid pressure planned ? ?PONV Risk Score and Plan: 3 and Ondansetron, Dexamethasone and Midazolam ? ?Airway Management Planned: Oral ETT ? ?Additional Equipment:  ? ?Intra-op Plan:  ? ?Post-operative Plan: Extubation in OR ? ?Informed Consent: I have reviewed the patients History and Physical, chart, labs and discussed the procedure including the risks, benefits and alternatives for the proposed anesthesia with the patient or authorized representative who has indicated his/her understanding and acceptance.  ? ? ? ?Dental advisory given ? ?Plan Discussed with: CRNA ? ?Anesthesia Plan Comments:   ? ? ? ? ?Anesthesia Quick Evaluation ? ?

## 2021-12-16 NOTE — Anesthesia Procedure Notes (Addendum)
Procedure Name: Intubation ?Date/Time: 12/16/2021 12:33 PM ?Performed by: Kyung Rudd, CRNA ?Pre-anesthesia Checklist: Patient identified, Emergency Drugs available, Suction available and Patient being monitored ?Patient Re-evaluated:Patient Re-evaluated prior to induction ?Oxygen Delivery Method: Circle system utilized ?Preoxygenation: Pre-oxygenation with 100% oxygen ?Induction Type: IV induction and Rapid sequence ?Laryngoscope Size: Mac and 4 ?Grade View: Grade I ?Tube type: Oral ?Tube size: 7.5 mm ?Number of attempts: 1 ?Airway Equipment and Method: Stylet ?Placement Confirmation: ETT inserted through vocal cords under direct vision, positive ETCO2 and breath sounds checked- equal and bilateral ?Secured at: 22 cm ?Tube secured with: Tape ?Dental Injury: Teeth and Oropharynx as per pre-operative assessment  ? ? ? ? ?

## 2021-12-16 NOTE — Transfer of Care (Signed)
Immediate Anesthesia Transfer of Care Note ? ?Patient: Michael Juarez ? ?Procedure(s) Performed: OPEN REDUCTION INTERNAL FIXATION (ORIF) DISTAL HUMERUS FRACTURE (Right) ?EXTERNAL FIXATION KNEE (Right) ? ?Patient Location: PACU ? ?Anesthesia Type:General ? ?Level of Consciousness: drowsy ? ?Airway & Oxygen Therapy: Patient Spontanous Breathing and Patient connected to face mask oxygen ? ?Post-op Assessment: Report given to RN and Post -op Vital signs reviewed and stable ? ?Post vital signs: Reviewed and stable ? ?Last Vitals:  ?Vitals Value Taken Time  ?BP 133/93 12/16/21 1635  ?Temp    ?Pulse 78 12/16/21 1636  ?Resp 18 12/16/21 1636  ?SpO2 100 % 12/16/21 1636  ?Vitals shown include unvalidated device data. ? ?Last Pain:  ?Vitals:  ? 12/16/21 1134  ?TempSrc: Oral  ?PainSc:   ?   ? ?  ? ?Complications: No notable events documented. ?

## 2021-12-16 NOTE — Plan of Care (Signed)

## 2021-12-16 NOTE — Progress Notes (Signed)
Orthopedic Tech Progress Note ?Patient Details:  ?Michael Juarez ?Oct 25, 1990 ?[id number] ? ?Ortho Devices ?Type of Ortho Device: Long arm splint, Knee Immobilizer ?Ortho Device/Splint Location: Right Arm, Right Knee ?Ortho Device/Splint Interventions: Application ?  ?Post Interventions ?Patient Tolerated: Well ? ?[name] ?12/16/2021, 8:41 AM ? ?

## 2021-12-16 NOTE — Brief Op Note (Signed)
? ?  Brief Op Note ? ?Date of Surgery: ?12/16/2021 ? ?Preoperative Diagnosis: ?Right Humerus Fx, Right Knee Fx ? ?Postoperative Diagnosis: ?same ? ?Procedure: ?Procedure(s): ?OPEN REDUCTION INTERNAL FIXATION (ORIF) DISTAL HUMERUS FRACTURE ?EXTERNAL FIXATION KNEE ? ?Implants: ?Implant Name Type Inv. Item Serial No. Manufacturer Lot No. LRB No. Used Action  ?PLATE HUMERUS 37S - [id number] Plate PLATE HUMERUS 60V  [employer]ORTH,TRAU,[employer],SG)  Right 1 Implanted  ?SCREW CORTICAL 3.5X24MM - [id number] Screw SCREW CORTICAL 3.5X24MM  [employer](ORTH,TRAU,BIO,SG)  Right 1 Implanted  ?SCREW CORT 3.5X26 - [id number] Screw SCREW CORT 3.5X26  [employer](ORTH,TRAU,BIO,SG)  Right 1 Implanted  ?SCREW CORT 3.5X32 - [id number] Screw SCREW CORT 3.5X32  [employer](ORTH,TRAU,BIO,SG)  Right 2 Implanted  ?SCREW CORTICAL 3.5MM  - [id number] Screw SCREW CORTICAL 3.5MM   [employer](ORTH,TRAU,BIO,SG)  Right 3 Implanted  ?SCREW CORTICAL 3.5MM  - [id number] Screw SCREW CORTICAL 3.5MM   [employer](ORTH,TRAU,BIO,SG)  Right 1 Implanted  ?SCREW CORTICAL 3.5MM  - [id number] Screw SCREW CORTICAL 3.5MM   [employer](ORTH,TRAU,BIO,SG)  Right 3 Implanted  ? ? ?Surgeons: ?Surgeon(s): ?Huel Cote, MD ? ?Anesthesia: ?General ? ? ? ?Estimated Blood Loss: ?See anesthesia record ? ?Complications: ?None ? ?Condition to PACU: ?Stable ? ?Benancio Deeds, MD ?12/16/2021 ?4:03 PM ? ?
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
## 2021-12-17 LAB — CBC
HCT: 34.2 % — ABNORMAL LOW (ref 39.0–52.0)
Hemoglobin: 11.6 g/dL — ABNORMAL LOW (ref 13.0–17.0)
MCH: 28.2 pg (ref 26.0–34.0)
MCHC: 33.9 g/dL (ref 30.0–36.0)
MCV: 83.2 fL (ref 80.0–100.0)
Platelets: 285 K/uL (ref 150–400)
RBC: 4.11 MIL/uL — ABNORMAL LOW (ref 4.22–5.81)
RDW: 11.9 % (ref 11.5–15.5)
WBC: 11 K/uL — ABNORMAL HIGH (ref 4.0–10.5)
nRBC: 0 % (ref 0.0–0.2)

## 2021-12-17 LAB — BASIC METABOLIC PANEL
Anion gap: 8 (ref 5–15)
BUN: 10 mg/dL (ref 6–20)
CO2: 25 mmol/L (ref 22–32)
Calcium: 8.5 mg/dL — ABNORMAL LOW (ref 8.9–10.3)
Chloride: 102 mmol/L (ref 98–111)
Creatinine, Ser: 0.98 mg/dL (ref 0.61–1.24)
GFR, Estimated: >60 mL/min (ref >60)
Glucose, Bld: 142 mg/dL — ABNORMAL HIGH (ref 70–99)
Potassium: 4.1 mmol/L (ref 3.5–5.1)
Sodium: 135 mmol/L (ref 135–145)

## 2021-12-17 LAB — PROTIME-INR
INR: 1.1 (ref 0.8–1.2)
Prothrombin Time: 14 seconds (ref 11.4–15.2)

## 2021-12-17 MED ORDER — CHLORHEXIDINE GLUCONATE CLOTH 2 % EX PADS
6.0000 | MEDICATED_PAD | Freq: Every day | CUTANEOUS | Status: DC
  Administered 2021-12-17 – 2021-12-21 (×5): 6 via TOPICAL

## 2021-12-17 MED ORDER — NICOTINE 14 MG/24HR TD PT24
14.0000 mg | MEDICATED_PATCH | Freq: Every day | TRANSDERMAL | Status: DC
  Administered 2021-12-17 – 2021-12-21 (×5): 14 mg via TRANSDERMAL
  Filled 2021-12-17 (×5): qty 1
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
## 2021-12-17 NOTE — Progress Notes (Signed)
? ?  Subjective: ? ?Patient reports pain as mild.  Controlled with pain medication.  He is at the bedside with his daughter and wife. ? ?Objective:  ? ?VITALS:   ?Vitals:  ? 12/16/21 1808 12/16/21 2022 12/17/21 0438 12/17/21 0728  ?BP: 132/79 136/83 134/89 118/80  ?Pulse: 80 88 80 88  ?Resp: 18 20  17   ?Temp: 98.3 ?F (36.8 ?C) 98 ?F (36.7 ?C) 98.6 ?F (37 ?C) 98.2 ?F (36.8 ?C)  ?TempSrc:  Oral Oral Oral  ?SpO2: 96% 97% 100% 99%  ?Weight:      ?Height:      ? ? ?Right upper extremity soft dressing is clean dry and intact.  He is able to flex and extend at the right elbow.  Fires EPL as well as wrist extensors.  2+ radial pulse. ? ?Right lower extremity external fixator is in place.  Pin site dressings are clean dry intact.  There is no drainage from these.  Stable to dorsiflex at all of his toes and great toe.  2+ dorsalis pedis pulse.  Sensation intact all distributions of the right leg.  Compartments are swollen but compressible.  There is no concern for compartment syndrome at this time. ? ? ?Lab Results  ?Component Value Date  ? WBC 11.0 (H) 12/17/2021  ? HGB 11.6 (L) 12/17/2021  ? HCT 34.2 (L) 12/17/2021  ? MCV 83.2 12/17/2021  ? PLT 285 12/17/2021  ? ? ? ?Assessment/Plan: ? ?1 Day Post-Op status post right humeral open reduction internal fixation as well as right knee spanning external fixator.  Plan for staged return to operating room for open reduction internal fixation of right proximal tibia.  This will likely be midweek ? ?- Expected postop acute blood loss anemia - will monitor for symptoms ?- Patient to work with PT/OT to optimize mobilization safely ?- DVT ppx - SCDs, ambulation, Lovenox daily ?- Postoperative Abx: Ancef x 2 additional doses given ?-Nonweightbearing right lower extremity.  He may use the right upper extremity for elbow range of motion although he should not be bearing significant weight to this in terms of transfers ?- Pain control - multimodal pain management, ATC acetaminophen in  conjunction with as needed narcotic (oxycodone), although this should be minimized with other modalities  ?-Plan for staged return to operating room midweek for right tibia external fixation removal and transition to open reduction internal fixation ? ?Michael Juarez ?12/17/2021, 7:47 AM ? ?

## 2021-12-17 NOTE — Plan of Care (Signed)
  Problem: Education: Goal: Knowledge of General Education information will improve Description: Including pain rating scale, medication(s)/side effects and non-pharmacologic comfort measures Outcome: Progressing   Problem: Health Behavior/Discharge Planning: Goal: Ability to manage health-related needs will improve Outcome: Progressing   Problem: Clinical Measurements: Goal: Will remain free from infection Outcome: Progressing   

## 2021-12-17 NOTE — Progress Notes (Signed)
RN asked and educated patient about foley catheter.Pt, refused to d/c foley at this time. Will continue to reassess. ?
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
## 2021-12-18 ENCOUNTER — Encounter (HOSPITAL_COMMUNITY): Payer: Self-pay | Admitting: Orthopaedic Surgery

## 2021-12-18 MED ORDER — MUPIROCIN 2 % EX OINT
1.0000 "application " | TOPICAL_OINTMENT | Freq: Two times a day (BID) | CUTANEOUS | Status: DC
  Administered 2021-12-18 – 2021-12-21 (×6): 1 via NASAL
  Filled 2021-12-18 (×4): qty 22

## 2021-12-18 MED ORDER — HYDROMORPHONE HCL 2 MG PO TABS
2.0000 mg | ORAL_TABLET | ORAL | Status: DC | PRN
  Administered 2021-12-19 – 2021-12-21 (×3): 2 mg via ORAL
  Filled 2021-12-18 (×4): qty 1

## 2021-12-18 MED ORDER — HYDROMORPHONE HCL 2 MG PO TABS
4.0000 mg | ORAL_TABLET | ORAL | Status: DC | PRN
  Administered 2021-12-18 – 2021-12-21 (×6): 4 mg via ORAL
  Filled 2021-12-18 (×7): qty 2

## 2021-12-18 MED ORDER — ENOXAPARIN SODIUM 40 MG/0.4ML IJ SOSY
40.0000 mg | PREFILLED_SYRINGE | INTRAMUSCULAR | Status: DC
  Administered 2021-12-20 – 2021-12-21 (×2): 40 mg via SUBCUTANEOUS
  Filled 2021-12-18 (×2): qty 0.4

## 2021-12-18 MED ORDER — CHLORHEXIDINE GLUCONATE CLOTH 2 % EX PADS
6.0000 | MEDICATED_PAD | Freq: Every day | CUTANEOUS | Status: DC
  Administered 2021-12-19 – 2021-12-20 (×2): 6 via TOPICAL

## 2021-12-18 NOTE — H&P (View-Only) (Signed)
? ?  Subjective: ? ?Patient reports pain as mild.  Controlled with pain medication. Pending definitive fixation 3/14 for tibial plateau. Denies numbness/tingling in leg. Working on elbow range of motion. ? ?Objective:  ? ?VITALS:   ?Vitals:  ? 12/17/21 0438 12/17/21 0728 12/17/21 1509 12/17/21 2000  ?BP: 134/89 118/80 (!) 143/89 124/84  ?Pulse: 80 88 (!) 109 95  ?Resp:  17 16 17   ?Temp: 98.6 ?F (37 ?C) 98.2 ?F (36.8 ?C) 99.6 ?F (37.6 ?C) 98.4 ?F (36.9 ?C)  ?TempSrc: Oral Oral Oral Oral  ?SpO2: 100% 99% 100% 95%  ?Weight:      ?Height:      ? ? ?Right upper extremity soft dressing is clean dry and intact.  He is able to flex and extend at the right elbow.  Fires EPL as well as wrist extensors.  2+ radial pulse. ? ?Right lower extremity external fixator is in place.  Pin site dressings are clean dry intact.  There is no drainage from these.  Stable to dorsiflex at all of his toes and great toe.  2+ dorsalis pedis pulse.  Sensation intact all distributions of the right leg.  Compartments are swollen but compressible.  There is no concern for compartment syndrome at this time. ? ? ?Lab Results  ?Component Value Date  ? WBC 11.0 (H) 12/17/2021  ? HGB 11.6 (L) 12/17/2021  ? HCT 34.2 (L) 12/17/2021  ? MCV 83.2 12/17/2021  ? PLT 285 12/17/2021  ? ? ? ?Assessment/Plan: ? ?2 Days Post-Op status post right humeral open reduction internal fixation as well as right knee spanning external fixator.  Plan for staged return to operating room for open reduction internal fixation of right proximal tibia. ? ?- Expected postop acute blood loss anemia - will monitor for symptoms ?- Patient to work with PT/OT to optimize mobilization safely ?- DVT ppx - SCDs, ambulation, Lovenox daily ?- Npo at [facility] tonight for OR tomorrow ?-Nonweightbearing right lower extremity.  He may use the right upper extremity for elbow range of motion although he should not be bearing significant weight to this in terms of transfers ?- Pain control - multimodal pain  management, ATC acetaminophen in conjunction with as needed narcotic (oxycodone), although this should be minimized with other modalities  ?-Plan for return to operating room 3/14 for right tibia external fixation removal and transition to open reduction internal fixation ? ?Emmalin Jaquess ?12/18/2021, 7:05 AM ? ?

## 2021-12-18 NOTE — Progress Notes (Signed)
? ?  Subjective: ? ?Patient reports pain as mild.  Controlled with pain medication. Pending definitive fixation 3/14 for tibial plateau. Denies numbness/tingling in leg. Working on elbow range of motion. ? ?Objective:  ? ?VITALS:   ?Vitals:  ? 12/17/21 0438 12/17/21 0728 12/17/21 1509 12/17/21 2000  ?BP: 134/89 118/80 (!) 143/89 124/84  ?Pulse: 80 88 (!) 109 95  ?Resp:  17 16 17  ?Temp: 98.6 ?F (37 ?C) 98.2 ?F (36.8 ?C) 99.6 ?F (37.6 ?C) 98.4 ?F (36.9 ?C)  ?TempSrc: Oral Oral Oral Oral  ?SpO2: 100% 99% 100% 95%  ?Weight:      ?Height:      ? ? ?Right upper extremity soft dressing is clean dry and intact.  He is able to flex and extend at the right elbow.  Fires EPL as well as wrist extensors.  2+ radial pulse. ? ?Right lower extremity external fixator is in place.  Pin site dressings are clean dry intact.  There is no drainage from these.  Stable to dorsiflex at all of his toes and great toe.  2+ dorsalis pedis pulse.  Sensation intact all distributions of the right leg.  Compartments are swollen but compressible.  There is no concern for compartment syndrome at this time. ? ? ?Lab Results  ?Component Value Date  ? WBC 11.0 (H) 12/17/2021  ? HGB 11.6 (L) 12/17/2021  ? HCT 34.2 (L) 12/17/2021  ? MCV 83.2 12/17/2021  ? PLT 285 12/17/2021  ? ? ? ?Assessment/Plan: ? ?2 Days Post-Op status post right humeral open reduction internal fixation as well as right knee spanning external fixator.  Plan for staged return to operating room for open reduction internal fixation of right proximal tibia. ? ?- Expected postop acute blood loss anemia - will monitor for symptoms ?- Patient to work with PT/OT to optimize mobilization safely ?- DVT ppx - SCDs, ambulation, Lovenox daily ?- Npo at [facility] tonight for OR tomorrow ?-Nonweightbearing right lower extremity.  He may use the right upper extremity for elbow range of motion although he should not be bearing significant weight to this in terms of transfers ?- Pain control - multimodal pain  management, ATC acetaminophen in conjunction with as needed narcotic (oxycodone), although this should be minimized with other modalities  ?-Plan for return to operating room 3/14 for right tibia external fixation removal and transition to open reduction internal fixation ? ?Scotti Kosta ?12/18/2021, 7:05 AM ? ?

## 2021-12-18 NOTE — Plan of Care (Signed)

## 2021-12-18 NOTE — Progress Notes (Signed)
PT Cancellation Note ? ?Patient Details ?Name: TUKKER CEBOLLERO ?MRN: [id number] ?DOB: [birth year] ? ? ?Cancelled Treatment:    Reason Eval/Treat Not Completed: Patient declined, no reason specified. Asks if I can come back later. Will return later as time allows.  ? ? ?Jahne Krukowski ?12/18/2021, 12:34 PM ?
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
## 2021-12-19 ENCOUNTER — Inpatient Hospital Stay (HOSPITAL_COMMUNITY): Payer: Medicaid Other | Admitting: Certified Registered Nurse Anesthetist

## 2021-12-19 ENCOUNTER — Other Ambulatory Visit: Payer: Self-pay

## 2021-12-19 ENCOUNTER — Encounter (HOSPITAL_COMMUNITY)
Admission: EM | Disposition: A | Discharge status: Home / Self Care | Payer: Self-pay | Source: Home / Self Care | Attending: Orthopaedic Surgery

## 2021-12-19 ENCOUNTER — Inpatient Hospital Stay (HOSPITAL_COMMUNITY): Payer: Medicaid Other

## 2021-12-19 ENCOUNTER — Encounter (HOSPITAL_COMMUNITY): Payer: Self-pay | Admitting: Orthopaedic Surgery

## 2021-12-19 DIAGNOSIS — S83261A Peripheral tear of lateral meniscus, current injury, right knee, initial encounter: Secondary | ICD-10-CM

## 2021-12-19 DIAGNOSIS — S82201A Unspecified fracture of shaft of right tibia, initial encounter for closed fracture: Secondary | ICD-10-CM

## 2021-12-19 SURGERY — REMOVAL, EXTERNAL FIXATION DEVICE, LOWER EXTREMITY
Anesthesia: General | Laterality: Right

## 2021-12-19 MED ORDER — MIDAZOLAM HCL 2 MG/2ML IJ SOLN
INTRAMUSCULAR | Status: AC
  Filled 2021-12-19: qty 2

## 2021-12-19 MED ORDER — CEFAZOLIN SODIUM-DEXTROSE 2-4 GM/100ML-% IV SOLN
INTRAVENOUS | Status: AC
  Filled 2021-12-19: qty 100

## 2021-12-19 MED ORDER — FENTANYL CITRATE (PF) 250 MCG/5ML IJ SOLN
INTRAMUSCULAR | Status: AC
  Filled 2021-12-19: qty 5

## 2021-12-19 MED ORDER — TRANEXAMIC ACID-NACL 1000-0.7 MG/100ML-% IV SOLN
1000.0000 mg | INTRAVENOUS | Status: AC
  Administered 2021-12-19: 1000 mg via INTRAVENOUS
  Filled 2021-12-19: qty 100

## 2021-12-19 MED ORDER — PROPOFOL 10 MG/ML IV BOLUS
INTRAVENOUS | Status: DC | PRN
  Administered 2021-12-19: 200 mg via INTRAVENOUS
  Administered 2021-12-19: 100 mg via INTRAVENOUS

## 2021-12-19 MED ORDER — VANCOMYCIN HCL 1000 MG IV SOLR
INTRAVENOUS | Status: AC
  Filled 2021-12-19: qty 20

## 2021-12-19 MED ORDER — HYDROMORPHONE HCL 1 MG/ML IJ SOLN
INTRAMUSCULAR | Status: AC
  Filled 2021-12-19: qty 0.5

## 2021-12-19 MED ORDER — LIDOCAINE 2% (20 MG/ML) 5 ML SYRINGE
INTRAMUSCULAR | Status: DC | PRN
  Administered 2021-12-19: 60 mg via INTRAVENOUS

## 2021-12-19 MED ORDER — HYDROMORPHONE HCL 1 MG/ML IJ SOLN
INTRAMUSCULAR | Status: DC | PRN
  Administered 2021-12-19 (×2): .5 mg via INTRAVENOUS

## 2021-12-19 MED ORDER — FENTANYL CITRATE (PF) 100 MCG/2ML IJ SOLN: 25.0000 ug | INTRAMUSCULAR | Status: DC | PRN

## 2021-12-19 MED ORDER — ONDANSETRON HCL 4 MG/2ML IJ SOLN
INTRAMUSCULAR | Status: DC | PRN
  Administered 2021-12-19: 4 mg via INTRAVENOUS

## 2021-12-19 MED ORDER — ACETAMINOPHEN 500 MG PO TABS
1000.0000 mg | ORAL_TABLET | Freq: Once | ORAL | Status: AC
  Administered 2021-12-19: 1000 mg via ORAL

## 2021-12-19 MED ORDER — PROPOFOL 500 MG/50ML IV EMUL
INTRAVENOUS | Status: DC | PRN
  Administered 2021-12-19: 50 ug/kg/min via INTRAVENOUS

## 2021-12-19 MED ORDER — ROCURONIUM BROMIDE 10 MG/ML (PF) SYRINGE
PREFILLED_SYRINGE | INTRAVENOUS | Status: DC | PRN
  Administered 2021-12-19: 100 mg via INTRAVENOUS

## 2021-12-19 MED ORDER — DEXMEDETOMIDINE (PRECEDEX) IN NS 20 MCG/5ML (4 MCG/ML) IV SYRINGE
PREFILLED_SYRINGE | INTRAVENOUS | Status: DC | PRN
  Administered 2021-12-19: 40 ug via INTRAVENOUS

## 2021-12-19 MED ORDER — CEFAZOLIN SODIUM-DEXTROSE 2-3 GM-%(50ML) IV SOLR
INTRAVENOUS | Status: DC | PRN
  Administered 2021-12-19: 2 g via INTRAVENOUS

## 2021-12-19 MED ORDER — FENTANYL CITRATE (PF) 250 MCG/5ML IJ SOLN
INTRAMUSCULAR | Status: DC | PRN
  Administered 2021-12-19: 100 ug via INTRAVENOUS
  Administered 2021-12-19: 150 ug via INTRAVENOUS

## 2021-12-19 MED ORDER — DEXAMETHASONE SODIUM PHOSPHATE 10 MG/ML IJ SOLN
INTRAMUSCULAR | Status: DC | PRN
  Administered 2021-12-19: 10 mg via INTRAVENOUS

## 2021-12-19 MED ORDER — 0.9 % SODIUM CHLORIDE (POUR BTL) OPTIME
TOPICAL | Status: DC | PRN
  Administered 2021-12-19: 1000 mL

## 2021-12-19 MED ORDER — CEFAZOLIN SODIUM-DEXTROSE 2-4 GM/100ML-% IV SOLN
2.0000 g | Freq: Three times a day (TID) | INTRAVENOUS | Status: AC
  Administered 2021-12-19 – 2021-12-20 (×2): 2 g via INTRAVENOUS
  Filled 2021-12-19 (×2): qty 100

## 2021-12-19 MED ORDER — LACTATED RINGERS IV SOLN: INTRAVENOUS | Status: DC

## 2021-12-19 MED ORDER — ONDANSETRON HCL 4 MG/2ML IJ SOLN
INTRAMUSCULAR | Status: AC
  Filled 2021-12-19: qty 4

## 2021-12-19 MED ORDER — MIDAZOLAM HCL 2 MG/2ML IJ SOLN
INTRAMUSCULAR | Status: DC | PRN
  Administered 2021-12-19: 2 mg via INTRAVENOUS

## 2021-12-19 MED ORDER — SUGAMMADEX SODIUM 200 MG/2ML IV SOLN
INTRAVENOUS | Status: DC | PRN
  Administered 2021-12-19: 136 mg via INTRAVENOUS

## 2021-12-19 MED ORDER — PHENYLEPHRINE HCL-NACL 20-0.9 MG/250ML-% IV SOLN
INTRAVENOUS | Status: DC | PRN
  Administered 2021-12-19: 25 ug/min via INTRAVENOUS

## 2021-12-19 MED ORDER — HYDROMORPHONE HCL 1 MG/ML IJ SOLN: 0.2500 mg | INTRAMUSCULAR | Status: DC | PRN

## 2021-12-19 MED ORDER — DEXAMETHASONE SODIUM PHOSPHATE 10 MG/ML IJ SOLN
INTRAMUSCULAR | Status: AC
  Filled 2021-12-19: qty 1

## 2021-12-19 MED ORDER — ACETAMINOPHEN 500 MG PO TABS
ORAL_TABLET | ORAL | Status: AC
  Filled 2021-12-19: qty 2

## 2021-12-19 MED ORDER — ORAL CARE MOUTH RINSE: 15.0000 mL | Freq: Once | OROMUCOSAL | Status: AC

## 2021-12-19 MED ORDER — CHLORHEXIDINE GLUCONATE 0.12 % MT SOLN
OROMUCOSAL | Status: AC
  Administered 2021-12-19: 15 mL via OROMUCOSAL
  Filled 2021-12-19: qty 15

## 2021-12-19 MED ORDER — VANCOMYCIN HCL 1000 MG IV SOLR
INTRAVENOUS | Status: DC | PRN
  Administered 2021-12-19: 1000 mg via TOPICAL

## 2021-12-19 MED ORDER — CEFAZOLIN SODIUM-DEXTROSE 2-4 GM/100ML-% IV SOLN
2.0000 g | INTRAVENOUS | Status: AC
  Administered 2021-12-19: 2 g via INTRAVENOUS
  Filled 2021-12-19: qty 100

## 2021-12-19 MED ORDER — CHLORHEXIDINE GLUCONATE 0.12 % MT SOLN: 15.0000 mL | Freq: Once | OROMUCOSAL | Status: AC

## 2021-12-19 MED ORDER — LIDOCAINE 2% (20 MG/ML) 5 ML SYRINGE
INTRAMUSCULAR | Status: AC
  Filled 2021-12-19: qty 5

## 2021-12-19 MED ORDER — ROCURONIUM BROMIDE 10 MG/ML (PF) SYRINGE
PREFILLED_SYRINGE | INTRAVENOUS | Status: AC
  Filled 2021-12-19: qty 10

## 2021-12-19 MED ORDER — CHLORHEXIDINE GLUCONATE 4 % EX LIQD
60.0000 mL | Freq: Once | CUTANEOUS | Status: AC
  Administered 2021-12-19: 4 via TOPICAL

## 2021-12-19 SURGICAL SUPPLY — 79 items
BAG COUNTER SPONGE SURGICOUNT (BAG) ×3 IMPLANT
BAG SPNG CNTER NS LX DISP (BAG) ×1
BIT DRILL 2.5MM SMALL QC EVOS (BIT) IMPLANT
BIT DRILL QC 2.5MM SHRT EVO SM (DRILL) IMPLANT
BLADE CLIPPER SURG (BLADE) IMPLANT
BLADE SURG 10 STRL SS (BLADE) ×3 IMPLANT
BLADE SURG 15 STRL LF DISP TIS (BLADE) ×2 IMPLANT
BLADE SURG 15 STRL SS (BLADE) ×2
BNDG CMPR MED 15X6 ELC VLCR LF (GAUZE/BANDAGES/DRESSINGS) ×1
BNDG ELASTIC 4X5.8 VLCR STR LF (GAUZE/BANDAGES/DRESSINGS) ×1 IMPLANT
BNDG ELASTIC 6X15 VLCR STRL LF (GAUZE/BANDAGES/DRESSINGS) ×1 IMPLANT
BONE CANC CHIPS 20CC PCAN1/4 (Bone Implant) ×2 IMPLANT
BRUSH SCRUB EZ PLAIN DRY (MISCELLANEOUS) ×6 IMPLANT
CANISTER SUCT 3000ML PPV (MISCELLANEOUS) ×3 IMPLANT
CHIPS CANC BONE 20CC PCAN1/4 (Bone Implant) ×1 IMPLANT
COVER SURGICAL LIGHT HANDLE (MISCELLANEOUS) ×6 IMPLANT
DRAPE C-ARM 42X72 X-RAY (DRAPES) ×3 IMPLANT
DRAPE C-ARMOR (DRAPES) ×3 IMPLANT
DRAPE HALF SHEET 40X57 (DRAPES) IMPLANT
DRAPE INCISE IOBAN 66X45 STRL (DRAPES) ×3 IMPLANT
DRAPE U-SHAPE 47X51 STRL (DRAPES) ×3 IMPLANT
DRILL 2.5MM SMALL QC EVOS (BIT) ×2
DRILL QC 2.5MM SHORT EVOS SM (DRILL) ×2
ELECT REM PT RETURN 9FT ADLT (ELECTROSURGICAL) ×2
ELECTRODE REM PT RTRN 9FT ADLT (ELECTROSURGICAL) ×2 IMPLANT
GAUZE SPONGE 4X4 12PLY STRL (GAUZE/BANDAGES/DRESSINGS) ×1 IMPLANT
GLOVE SRG 8 PF TXTR STRL LF DI (GLOVE) ×2 IMPLANT
GLOVE SURG ENC MOIS LTX SZ8 (GLOVE) ×3 IMPLANT
GLOVE SURG ORTHO LTX SZ7.5 (GLOVE) ×6 IMPLANT
GLOVE SURG UNDER POLY LF SZ7.5 (GLOVE) ×3 IMPLANT
GLOVE SURG UNDER POLY LF SZ8 (GLOVE) ×2
GOWN STRL REUS W/ TWL LRG LVL3 (GOWN DISPOSABLE) ×4 IMPLANT
GOWN STRL REUS W/ TWL XL LVL3 (GOWN DISPOSABLE) ×2 IMPLANT
GOWN STRL REUS W/TWL LRG LVL3 (GOWN DISPOSABLE) ×4
GOWN STRL REUS W/TWL XL LVL3 (GOWN DISPOSABLE) ×2
GRAFT BNE CANC CHIPS 1-8 20CC (Bone Implant) IMPLANT
IMMOBILIZER KNEE 22 UNIV (SOFTGOODS) ×3 IMPLANT
K-WIRE 1.6 (WIRE) ×6
K-WIRE FX150X1.6XTROC PNT (WIRE) ×3
KIT BASIN OR (CUSTOM PROCEDURE TRAY) ×3 IMPLANT
KWIRE FX150X1.6XTROC PNT (WIRE) IMPLANT
MANIFOLD NEPTUNE II (INSTRUMENTS) ×3 IMPLANT
NDL SUT 6 .5 CRC .975X.05 MAYO (NEEDLE) IMPLANT
NEEDLE MAYO TAPER (NEEDLE) ×2
NS IRRIG 1000ML POUR BTL (IV SOLUTION) ×3 IMPLANT
PACK ORTHO EXTREMITY (CUSTOM PROCEDURE TRAY) ×3 IMPLANT
PAD ARMBOARD 7.5X6 YLW CONV (MISCELLANEOUS) ×6 IMPLANT
PAD CAST 4YDX4 CTTN HI CHSV (CAST SUPPLIES) IMPLANT
PADDING CAST COTTON 4X4 STRL (CAST SUPPLIES) ×2
PLATE LAT PROX TIBIA 8H RT (Plate) ×1 IMPLANT
SCREW CORT 3.5X44MM ST EVOS (Screw) ×1 IMPLANT
SCREW CORT ST EVOS 3.5X65 (Screw) ×1 IMPLANT
SCREW CORT ST EVOS 3.5X80 (Screw) ×1 IMPLANT
SCREW CORT ST EVOS 3.5X85 (Screw) ×1 IMPLANT
SCREW CORT ST EVOS 3.5X90 (Screw) ×1 IMPLANT
SCREW CTX 3.5X34MM EVOS (Screw) ×1 IMPLANT
SCREW CTX 3.5X36MM EVOS (Screw) ×2 IMPLANT
SCREW CTX 3.5X48MM EVOS (Screw) ×1 IMPLANT
SCREW CTX 3.5X50MM EVOS (Screw) ×1 IMPLANT
SPONGE T-LAP 18X18 ~~LOC~~+RFID (SPONGE) ×4 IMPLANT
STAPLER VISISTAT 35W (STAPLE) ×1 IMPLANT
STOCKINETTE IMPERVIOUS LG (DRAPES) ×3 IMPLANT
SUCTION FRAZIER HANDLE 10FR (MISCELLANEOUS) ×2
SUCTION TUBE FRAZIER 10FR DISP (MISCELLANEOUS) ×2 IMPLANT
SUT FIBERWIRE #2 38 REV NDL BL (SUTURE) ×4
SUT VIC AB 0 CT1 27 (SUTURE) ×4
SUT VIC AB 0 CT1 27XBRD ANBCTR (SUTURE) ×2 IMPLANT
SUT VIC AB 1 CT1 27 (SUTURE)
SUT VIC AB 1 CT1 27XBRD ANBCTR (SUTURE) ×2 IMPLANT
SUT VIC AB 2-0 CT1 (SUTURE) ×1 IMPLANT
SUT VIC AB 2-0 CT1 27 (SUTURE)
SUT VIC AB 2-0 CT1 TAPERPNT 27 (SUTURE) ×4 IMPLANT
SUTURE FIBERWR#2 38 REV NDL BL (SUTURE) IMPLANT
TOWEL GREEN STERILE (TOWEL DISPOSABLE) ×6 IMPLANT
TOWEL GREEN STERILE FF (TOWEL DISPOSABLE) ×6 IMPLANT
TRAY FOLEY MTR SLVR 16FR STAT (SET/KITS/TRAYS/PACK) IMPLANT
TUBE CONNECTING 12X1/4 (SUCTIONS) ×3 IMPLANT
UNDERPAD 30X36 HEAVY ABSORB (UNDERPADS AND DIAPERS) ×3 IMPLANT
YANKAUER SUCT BULB TIP NO VENT (SUCTIONS) ×3 IMPLANT

## 2021-12-19 NOTE — Interval H&P Note (Signed)
History and Physical Interval Note: ? ?12/19/2021 ?1:41 PM ? ?Michael Juarez  has presented today for surgery, with the diagnosis of Tibia Fx.  The various methods of treatment have been discussed with the patient and family. After consideration of risks, benefits and other options for treatment, the patient has consented to  Procedure(s): ?REMOVAL EXTERNAL FIXATION LEG (Right) ?OPEN REDUCTION INTERNAL FIXATION (ORIF) TIBIAL PLATEAU (Right) as a surgical intervention.  The patient's history has been reviewed, patient examined, no change in status, stable for surgery.  I have reviewed the patient's chart and labs.  Questions were answered to the patient's satisfaction.   ? ? ?Huel Cote ? ? ?

## 2021-12-19 NOTE — Brief Op Note (Signed)
? ?  Brief Op Note ? ?Date of Surgery: ?12/19/2021 ? ?Preoperative Diagnosis: ?Tibia Fx ? ?Postoperative Diagnosis: ?same ? ?Procedure: ?Procedure(s): ?REMOVAL EXTERNAL FIXATION LEG ?OPEN REDUCTION INTERNAL FIXATION (ORIF) TIBIAL PLATEAU ? ?Implants: ?Implant Name Type Inv. Item Serial No. Manufacturer Lot No. LRB No. Used Action  ?BONE [facility] CHIPS [facility] PCAN1/4 - [id number] Bone Implant BONE [facility] CHIPS [facility] PCAN1/4 [phone] [employer]  Right 1 Implanted  ?SCREW CORT ST EVOS 3.5X85 - [id number] Screw SCREW CORT ST EVOS 3.5X85  [employer]  Right 1 Implanted  ?SCREW CORT ST EVOS 3.5X80 - [id number] Screw SCREW CORT ST EVOS 3.5X80  [employer]  Right 1 Implanted  ?PLATE LAT PROX TIBIA 8H RT - [id number] Plate PLATE LAT PROX TIBIA 8H RT  [employer]  Right 1 Implanted  ?SCREW CORT 3.5X44MM ST EVOS - [id number] Screw SCREW CORT 3.5X44MM ST EVOS  [employer]  Right 1 Implanted  ?SCREW CTX 3.5X36MM EVOS - [id number] Screw SCREW CTX 3.5X36MM EVOS  [employer]  Right 2 Implanted  ?SCREW CORT ST EVOS 3.5X65 - [id number] Screw SCREW CORT ST EVOS 3.5X65  [employer]  Right 1 Implanted  ? ? ?Surgeons: ?Surgeon(s): ?Huel Cote, MD ? ?Anesthesia: ?General ? ? ? ?Estimated Blood Loss: ?See anesthesia record ? ?Complications: ?None ? ?Condition to PACU: ?Stable ? ?Benancio Deeds, MD ?12/19/2021 ?5:16 PM ? ?

## 2021-12-19 NOTE — Anesthesia Procedure Notes (Signed)
Procedure Name: Intubation ?Date/Time: 12/19/2021 2:45 PM ?Performed by: Minerva Ends, CRNA ?Pre-anesthesia Checklist: Patient identified, Emergency Drugs available, Suction available and Patient being monitored ?Patient Re-evaluated:Patient Re-evaluated prior to induction ?Oxygen Delivery Method: Circle system utilized ?Preoxygenation: Pre-oxygenation with 100% oxygen ?Induction Type: IV induction ?Ventilation: Mask ventilation without difficulty ?Laryngoscope Size: Mac and 3 ?Grade View: Grade I ?Tube type: Oral ?Tube size: 7.0 mm ?Number of attempts: 1 ?Airway Equipment and Method: Stylet and Oral airway ?Placement Confirmation: ETT inserted through vocal cords under direct vision, positive ETCO2 and breath sounds checked- equal and bilateral ?Secured at: 23 cm ?Tube secured with: Tape ?Dental Injury: Teeth and Oropharynx as per pre-operative assessment  ? ? ? ? ?
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
## 2021-12-19 NOTE — TOC CAGE-AID Note (Signed)
Transition of Care (TOC) - CAGE-AID Screening ? ? ?Patient Details  ?Name: Michael Juarez ?MRN: [id number] ?Date of Birth: Oct 04, 1991 ? ?Transition of Care (TOC) CM/SW Contact:    ?Banner Huckaba C Tarpley-Carter, LCSWA ?Phone Number: ?12/19/2021, 3:26 PM ? ? ?Clinical Narrative: ?Pt is unable to participate in Cage Aid. ?Pt was absent from room.  CSW  will assess at a better time. ? ?Insurance underwriter, MSW, LCSW-A ?Pronouns:  She/Her/Hers ?[facility]itions of [facility] ?Clinical Social Worker ?Direct Number:  [phone] ?[name] ? ?CAGE-AID Screening: ?Substance Abuse Screening unable to be completed due to: : Patient unable to participate ? ?  ?  ?  ?  ?  ? ?  ? ?  ? ? ? ? ? ? ?

## 2021-12-19 NOTE — Progress Notes (Signed)
PT Cancellation Note ? ?Patient Details ?Name: Michael Juarez ?MRN: [id number] ?DOB: 1991/03/20 ? ? ?Cancelled Treatment:    Reason Eval/Treat Not Completed: Pain limiting ability to participate;Fatigue/lethargy limiting ability to participate Pt politely declining mobility, stating he is having increased pain and would like to rest until scheduled surgery. Will check back as schedule allows to continue with PT POC. ? ? ?Betsey Holiday Dejion Grillo ?12/19/2021, 10:52 AM ?
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
## 2021-12-19 NOTE — Transfer of Care (Signed)
Immediate Anesthesia Transfer of Care Note ? ?Patient: Michael Juarez ? ?Procedure(s) Performed: REMOVAL EXTERNAL FIXATION LEG (Right) ?OPEN REDUCTION INTERNAL FIXATION (ORIF) TIBIAL PLATEAU (Right) ? ?Patient Location: PACU ? ?Anesthesia Type:General ? ?Level of Consciousness: sedated ? ?Airway & Oxygen Therapy: Patient Spontanous Breathing and Patient connected to nasal cannula oxygen ? ?Post-op Assessment: Report given to RN and Post -op Vital signs reviewed and stable ? ?Post vital signs: Reviewed and stable ? ?Last Vitals:  ?Vitals Value Taken Time  ?BP 117/69 12/19/21 1720  ?Temp 36.7 ?C 12/19/21 1720  ?Pulse 98 12/19/21 1723  ?Resp 18 12/19/21 1723  ?SpO2 94 % 12/19/21 1723  ?Vitals shown include unvalidated device data. ? ?Last Pain:  ?Vitals:  ? 12/19/21 1356  ?TempSrc: Oral  ?PainSc: 8   ?   ? ?Patients Stated Pain Goal: 0 (12/19/21 5456) ? ?Complications: No notable events documented. ?

## 2021-12-20 ENCOUNTER — Encounter (HOSPITAL_COMMUNITY): Payer: Self-pay | Admitting: Orthopaedic Surgery

## 2021-12-20 LAB — CBC
HCT: 33.3 % — ABNORMAL LOW (ref 39.0–52.0)
Hemoglobin: 11.2 g/dL — ABNORMAL LOW (ref 13.0–17.0)
MCH: 28 pg (ref 26.0–34.0)
MCHC: 33.6 g/dL (ref 30.0–36.0)
MCV: 83.3 fL (ref 80.0–100.0)
Platelets: 319 10*3/uL (ref 150–400)
RBC: 4 MIL/uL — ABNORMAL LOW (ref 4.22–5.81)
RDW: 11.8 % (ref 11.5–15.5)
WBC: 10.1 10*3/uL (ref 4.0–10.5)
nRBC: 0 % (ref 0.0–0.2)

## 2021-12-20 LAB — BASIC METABOLIC PANEL
Anion gap: 8 (ref 5–15)
BUN: 9 mg/dL (ref 6–20)
CO2: 27 mmol/L (ref 22–32)
Calcium: 8.5 mg/dL — ABNORMAL LOW (ref 8.9–10.3)
Chloride: 101 mmol/L (ref 98–111)
Creatinine, Ser: 0.77 mg/dL (ref 0.61–1.24)
GFR, Estimated: >60 mL/min (ref >60)
Glucose, Bld: 138 mg/dL — ABNORMAL HIGH (ref 70–99)
Potassium: 4.3 mmol/L (ref 3.5–5.1)
Sodium: 136 mmol/L (ref 135–145)
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
## 2021-12-20 NOTE — Progress Notes (Addendum)
? ?  Subjective: ? ?Patient reports pain as well controlled with pain medication.  Denies any numbness or tingling in the right lower extremity.  Has been able to void.  Tolerating diet ? ?Objective:  ? ?VITALS:   ?Vitals:  ? 12/19/21 1750 12/19/21 2038 12/20/21 0614 12/20/21 0757  ?BP: 129/89 128/90 (!) 126/91 122/87  ?Pulse: 97 (!) 102 89 95  ?Resp: 15 19 20 16   ?Temp: 98.1 ?F (36.7 ?C) 98.6 ?F (37 ?C) 98.1 ?F (36.7 ?C) 98.4 ?F (36.9 ?C)  ?TempSrc:  Oral Oral Oral  ?SpO2: 93% 95% 98% 100%  ?Weight:      ?Height:      ? ? ?Right upper extremity lower extremity dressings are clean dry and intact.  Fires EPL as well as interosseous muscles of right hand.  2+ radial pulse.  Is able to gently flex at the right biceps and elbow. ? ?Right leg he has strong dorsiflexion as well as firing his EHL and plantar flexors.  Sensation intact all distributions of the right leg.  Compartments are soft and compressible. ? ? ?Lab Results  ?Component Value Date  ? WBC 10.1 12/20/2021  ? HGB 11.2 (L) 12/20/2021  ? HCT 33.3 (L) 12/20/2021  ? MCV 83.3 12/20/2021  ? PLT 319 12/20/2021  ? ? ? ?Assessment/Plan: ? ?1 Day Post-Op right tibial plateau open reduction internal fixation, overall doing well.  He is 3 days status post right humeral open reduction internal fixation ? ?- Expected postop acute blood loss anemia - will monitor for symptoms ?- Patient to work with PT/OT to optimize mobilization safely ?- DVT ppx - SCDs, ambulation, Lovenox ?- Postoperative Abx: Ancef x 2 additional doses given ?- NWB on both the right lower extremity and right upper extremities.  He should not use his right upper extremity for any type of walker assisted weightbearing.  He may gently range the right elbow and use his right shoulder as tolerated ?- Pain control - multimodal pain management, ATC acetaminophen in conjunction with as needed narcotic (oxycodone), although this should be minimized with other modalities  ?- Discharge planning pending CM,  appreciate coordination  ?- Patient with medical need for a wheel chair with  ?elevating leg rests (ELRs), wheel locks, extensions and anti-tippers.  ? 12/21/21 0817  ?  ? ? ? ?Vanetta Mulders ?12/20/2021, 10:40 AM ? ?
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
## 2021-12-20 NOTE — Anesthesia Postprocedure Evaluation (Signed)
Anesthesia Post Note ? ?Patient: Michael Juarez ? ?Procedure(s) Performed: REMOVAL EXTERNAL FIXATION LEG (Right) ?OPEN REDUCTION INTERNAL FIXATION (ORIF) TIBIAL PLATEAU (Right) ? ?  ? ?Patient location during evaluation: PACU ?Anesthesia Type: General ?Level of consciousness: awake and alert ?Pain management: pain level controlled ?Vital Signs Assessment: post-procedure vital signs reviewed and stable ?Respiratory status: spontaneous breathing, nonlabored ventilation, respiratory function stable and patient connected to nasal cannula oxygen ?Cardiovascular status: blood pressure returned to baseline and stable ?Postop Assessment: no apparent nausea or vomiting ?Anesthetic complications: no ? ? ?No notable events documented. ? ?Last Vitals:  ?Vitals:  ? 12/19/21 2038 12/20/21 0614  ?BP: 128/90 (!) 126/91  ?Pulse: (!) 102 89  ?Resp: 19 20  ?Temp: 37 ?C 36.7 ?C  ?SpO2: 95% 98%  ?  ?Last Pain:  ?Vitals:  ? 12/20/21 0614  ?TempSrc: Oral  ?PainSc:   ? ? ?  ?  ?  ?  ?  ?  ? ?Alexzandria Massman L Quinlan Mcfall ? ? ? ? ?

## 2021-12-20 NOTE — Plan of Care (Signed)
  Problem: Activity: Goal: Risk for activity intolerance will decrease Outcome: Progressing   Problem: Nutrition: Goal: Adequate nutrition will be maintained Outcome: Progressing   Problem: Elimination: Goal: Will not experience complications related to bowel motility Outcome: Progressing   

## 2021-12-21 MED ORDER — ASPIRIN EC 325 MG PO TBEC: 325.0000 mg | DELAYED_RELEASE_TABLET | Freq: Every day | ORAL | 0 refills | Status: AC

## 2021-12-21 MED ORDER — IBUPROFEN 800 MG PO TABS: 800.0000 mg | ORAL_TABLET | Freq: Three times a day (TID) | ORAL | 0 refills | Status: AC

## 2021-12-21 MED ORDER — ACETAMINOPHEN 500 MG PO TABS: 500.0000 mg | ORAL_TABLET | Freq: Three times a day (TID) | ORAL | 0 refills | Status: AC

## 2021-12-21 MED ORDER — HYDROMORPHONE HCL 2 MG PO TABS: 2.0000 mg | ORAL_TABLET | ORAL | 0 refills | Status: DC | PRN

## 2021-12-21 NOTE — Discharge Instructions (Signed)
? ? ?   Discharge Instructions  ? ? ?Attending Surgeon: Huel Cote, MD ?Office Phone Number: [phone] ? ? ?Diagnosis and Procedures:   ? ?Surgeries Performed: ?Right humerus fixation, right tibial plateau fixation ? ?Discharge Plan:  ? ? ?Diet: ?Resume usual diet. Begin with light or bland foods.  Drink plenty of fluids. ? ?Activity:  ?Non-weightbearing on right arm and right, until seen at postoperative Physical Therapy visit this week. Please keep your brace locked until follow-up. ? ?GENERAL INSTRUCTIONS: ?1.  Keep your surgical site elevated above your heart for at least 5-7 days or longer to prevent swelling. This will improve your comfort and your overall recovery following surgery.   ?  ?2. Please call Dr. Serena Croissant office at [phone] with questions Monday-Friday during business hours. If no one answers, please leave a message and someone should get back to the patient within 24 hours. For emergencies please call 911 or proceed to the emergency room.  ? ?3. Patient to notify surgical team if experiences any of the following: Bowel/Bladder dysfunction, uncontrolled pain, nerve/muscle weakness, incision with increased drainage or redness, nausea/vomiting and Fever greater than 101.0 F.  Be alert for signs of infection including redness, streaking, odor, fever or chills. Be alert for excessive pain or bleeding and notify your surgeon immediately. ? ?WOUND INSTRUCTIONS:   ?Leave your dressing/cast/splint in place until your post operative visit.  Keep it clean and dry. ? ?Always keep the incision clean and dry until the staples/sutures are removed. If there is no drainage from the incision you should keep it open to air. If there is drainage from the incision you must keep it covered at all times until the drainage stops ? Do not soak in a bath tub, hot tub, pool, lake or other body of water until 21 days after your surgery and your incision is completely dry and healed.  ?If you have removable  sutures (or staples) they must be removed 10-14 days (unless otherwise instructed) from the day of your surgery.  ? ? ? 1)  Elevate the extremity as much as possible. ? 2)  Keep the dressing clean and dry. ? 3)  Please call us if the dressing becomes wet or dirty. ? 4)  If you are experiencing worsening pain or worsening swelling, please call. ?  ?  ?MEDICATIONS: ?Resume all previous home medications at the previous prescribed dose and frequency unless otherwise noted ?Start taking the  pain medications on an as-needed basis as prescribed  ?Please taper down pain medication over the next week following surgery.  Ideally you should not require a refill of any narcotic pain medication.  ?Take pain medication with food to minimize nausea. ?In addition to the prescribed pain medication, you may take over-the-counter pain relievers such as Tylenol.  Do NOT take additional tylenol if your pain medication already has tylenol in it.  ?Aspirin 325mg  daily for four weeks. ? ?  ?  ?FOLLOWUP INSTRUCTIONS: ?1. Follow up at the Physical Therapy Clinic 3-4 days following surgery. This appointment should be scheduled unless other arrangements have been made.The Physical Therapy scheduling number is [phone] if an appointment has not already been arranged. ? ?2. Contact Dr. [phone] office during office hours at [phone] or the practice after hours line at [phone] for non-emergencies. For medical emergencies call 911. ? ? ?Discharge Location: Home  ?
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
## 2021-12-21 NOTE — Progress Notes (Signed)
Occupational Therapy Progress Note ? ?Pt is unable to ambulate due to NWB status Rt UE and Rt LE.  He is w/c dependent at this time for mobility and his home bathroom will not accommodate the w/c due to w/c being too wide.  Because pt is unable to access his bathroom, he will be dependent on use of BSC for elimination.   ? ?Marsia Cino C., OTR/L ?Acute Rehabilitation Services ?Pager [phone] ?Office [phone] ? ?

## 2021-12-21 NOTE — Progress Notes (Signed)
Had conversation about bowel movements informed patient to try to eat foods that have fiber such as fruits and vegetables. I did offer prune juice but he declined. Last BM 3/11 and patient is post surgery and taking narcotics informed that this could constipation. He verbal understand. Will continue to monitor. Arthor Captain LPN ?

## 2021-12-21 NOTE — TOC Transition Note (Signed)
Transition of Care (TOC) - CM/SW Discharge Note ? ? ?Patient Details  ?Name: Michael Juarez ?MRN: [id number] ?Date of Birth: 25-Apr-1991 ? ?Transition of Care [facility]) CM/SW Contact:  ?Epifanio Lesches, RN ?Phone Number: ?12/21/2021, 11:04 AM ? ? ?Clinical Narrative:    ?Patient will DC [id number] ?Anticipated DC date: 12/21/2021 ?Family notified: yes ?Transport by: car ? ?   -S/p R humerus ORIF, R tibial ex fix on 3/11. S/p R tibial ex fix removal, R proximal tibial ORIF on 3/14 ?Per MD patient ready for DC today. RN, patient, and patient's mom aware of DC. Referral made with [employer] for 3in1/BSC and WC. Equipment will be delivered to bedside prior to d/c. Pt without Rx med concerns . Mom to provide transportation to home. ? Post hospital f/u noted on AVS. ? ?  ?RNCM will sign off for now as intervention is no longer needed. Please consult [place] again if new needs arise.  ? ? ?Final next level of care: Home/Self Care ?  ? ? ?Patient Goals and CMS Choice ?  ?  ?Choice offered to / list presented to : Patient ? ?Discharge Placement ?  ?           ?  ?  ?  ?  ? ?Discharge Plan and Services ?  ?  ?           ?DME Arranged: Wheelchair manual, 3-N-1 ?DME Agency: [facility] ?  ?  ?  ?  ?  ?  ?  ?  ? ?Social Determinants of Health (SDOH) Interventions ?  ? ? ?Readmission Risk Interventions ?No flowsheet data found. ? ? ? ? ?

## 2021-12-21 NOTE — Plan of Care (Signed)
?  Problem: Education: ?Goal: Knowledge of General Education information will improve ?Description: Including pain rating scale, medication(s)/side effects and non-pharmacologic comfort measures ?Outcome: Progressing ?  ?Problem: Health Behavior/Discharge Planning: ?Goal: Ability to manage health-related needs will improve ?Outcome: Progressing ?  ?Problem: Clinical Measurements: ?Goal: Ability to maintain clinical measurements within normal limits will improve ?Outcome: Progressing ?Goal: Will remain free from infection ?Outcome: Progressing ?Goal: Diagnostic test results will improve ?Outcome: Progressing ?Goal: Respiratory complications will improve ?Outcome: Progressing ?Goal: Cardiovascular complication will be avoided ?Outcome: Progressing ?  ?Problem: Activity: ?Goal: Risk for activity intolerance will decrease ?Outcome: Progressing ?  ?Problem: Nutrition: ?Goal: Adequate nutrition will be maintained ?Outcome: Progressing ?  ?Problem: Coping: ?Goal: Level of anxiety will decrease ?Outcome: Progressing ?  ?Problem: Elimination: ?Goal: Will not experience complications related to bowel motility ?Outcome: Not Progressing ?  ?Problem: Elimination: ?Goal: Will not experience complications related to bowel motility ?Outcome: Not Progressing ?  ?Problem: Elimination: ?Goal: Will not experience complications related to urinary retention ?Outcome: Progressing ?  ?Problem: Pain Managment: ?Goal: General experience of comfort will improve ?Outcome: Progressing ?  ?Problem: Safety: ?Goal: Ability to remain free from injury will improve ?Outcome: Progressing ?  ?

## 2021-12-21 NOTE — Plan of Care (Signed)
Education complete.

## 2021-12-22 ENCOUNTER — Telehealth (HOSPITAL_BASED_OUTPATIENT_CLINIC_OR_DEPARTMENT_OTHER): Payer: Self-pay | Admitting: Orthopaedic Surgery

## 2021-12-22 ENCOUNTER — Other Ambulatory Visit (HOSPITAL_BASED_OUTPATIENT_CLINIC_OR_DEPARTMENT_OTHER): Payer: Self-pay | Admitting: Orthopaedic Surgery

## 2021-12-22 MED ORDER — HYDROMORPHONE HCL 2 MG PO TABS: 2.0000 mg | ORAL_TABLET | ORAL | 0 refills | Status: DC | PRN

## 2021-12-22 NOTE — Telephone Encounter (Signed)
Rx request 
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
## 2021-12-22 NOTE — TOC CAGE-AID Note (Signed)
Transition of Care (TOC) - CAGE-AID Screening ? ? ?Patient Details  ?Name: Michael Juarez ?MRN: [id number] ?Date of Birth: 11-Apr-1991 ? ?Transition of Care (TOC) CM/SW Contact:    ?Braley Luckenbaugh C Tarpley-Carter, LCSWA ?Phone Number: ?12/22/2021, 11:18 AM ? ? ?Clinical Narrative: ?Pt participated in [place].  Pt stated he does not use substance or ETOH.  Pt was not offered resources, due to no usage of substance or ETOH.    ? ?Passenger transport manager, MSW, LCSW-A ?Pronouns:  She/Her/Hers ?[facility]Transitions of Care ?Clinical Social Worker ?Direct Number:  [phone] ?[email or web address]  ? ?CAGE-AID Screening: ?Substance Abuse Screening unable to be completed due to: : Patient unable to participate ? ?Have You Ever Felt You Ought to Cut Down on Your Drinking or Drug Use?: No ?Have People Annoyed You By Critizing Your Drinking Or Drug Use?: No ?Have You Felt Bad Or Guilty About Your Drinking Or Drug Use?: No ?Have You Ever Had a Drink or Used Drugs First Thing In The Morning to Steady Your Nerves or to Get Rid of a Hangover?: No ?CAGE-AID Score: 0 ? ?Substance Abuse Education Offered: No ? ?  ? ? ? ? ? ? ?

## 2021-12-27 ENCOUNTER — Other Ambulatory Visit: Payer: Self-pay

## 2021-12-27 ENCOUNTER — Ambulatory Visit (INDEPENDENT_AMBULATORY_CARE_PROVIDER_SITE_OTHER): Payer: Medicaid Other | Admitting: Orthopaedic Surgery

## 2021-12-27 DIAGNOSIS — S82141A Displaced bicondylar fracture of right tibia, initial encounter for closed fracture: Secondary | ICD-10-CM

## 2021-12-27 DIAGNOSIS — S42361A Displaced segmental fracture of shaft of humerus, right arm, initial encounter for closed fracture: Secondary | ICD-10-CM

## 2021-12-27 MED ORDER — HYDROMORPHONE HCL 2 MG PO TABS: 2.0000 mg | ORAL_TABLET | ORAL | 0 refills | Status: DC | PRN

## 2021-12-27 NOTE — Progress Notes (Signed)
? ?                            ? ? ?Post Operative Evaluation ?  ?, ?Procedure/Date of Surgery: Right humeral open reduction internal fixation done 12/16/2021, right tibial plateau open reduction internal fixation 12/19/21 ? ?Interval History:  ? ? ?Michael Juarez presents today for his first postop following the above procedures.  He has been compliant with aspirin usage.  His pain is overall slowly improving.  This is mildly achy involving the right arm and right knee.  He has not been weightbearing on the right leg.  He has not been using the right arm for any type of heavy lifting.  He has been working on right elbow range of motion as well as right knee range of motion.  Denies any fevers or chills.  Has been taking Dilaudid for pain control ? ? ?PMH/PSH/Family History/Social History/Meds/Allergies:   ?No past medical history on file. ?Past Surgical History:  ?Procedure Laterality Date  ? EXTERNAL FIXATION LEG Right 12/16/2021  ? Procedure: EXTERNAL FIXATION KNEE;  Surgeon: Huel Cote, MD;  Location: [facility];  Service: Orthopedics;  Laterality: Right;  ? EXTERNAL FIXATION REMOVAL Right 12/19/2021  ? Procedure: REMOVAL EXTERNAL FIXATION LEG;  Surgeon: Huel Cote, MD;  Location: [facility];  Service: Orthopedics;  Laterality: Right;  ? FRACTURE SURGERY    ? ORIF HUMERUS FRACTURE Right 12/16/2021  ? Procedure: OPEN REDUCTION INTERNAL FIXATION (ORIF) DISTAL HUMERUS FRACTURE;  Surgeon: Huel Cote, MD;  Location: [facility];  Service: Orthopedics;  Laterality: Right;  ? ORIF TIBIA PLATEAU Right 12/19/2021  ? Procedure: OPEN REDUCTION INTERNAL FIXATION (ORIF) TIBIAL PLATEAU;  Surgeon: Huel Cote, MD;  Location: [facility];  Service: Orthopedics;  Laterality: Right;  ? ?Social History  ? ?Socioeconomic History  ? Marital status: Single  ?  Spouse name: Not on file  ? Number of children: Not on file  ? Years of education: Not on file  ? Highest education level: Not on file  ?Occupational History  ? Not on file  ?Tobacco Use  ?  Smoking status: Every Day  ?  Packs/day: 1.00  ?  Types: Cigarettes  ? Smokeless tobacco: Not on file  ?Substance and Sexual Activity  ? Alcohol use: Yes  ?  Comment: 10 units on the weekends  ? Drug use: No  ? Sexual activity: Not on file  ?Other Topics Concern  ? Not on file  ?Social History Narrative  ? Not on file  ? ?Social Determinants of Health  ? ?Financial Resource Strain: Not on file  ?Food Insecurity: Not on file  ?Transportation Needs: Not on file  ?Physical Activity: Not on file  ?Stress: Not on file  ?Social Connections: Not on file  ? ?No family history on file. ?Allergies  ?Allergen Reactions  ? Amoxicillin Other (See Comments)  ?  Unsure of reaction, he was a child  ? ?Current Outpatient Medications  ?Medication Sig Dispense Refill  ? acetaminophen (TYLENOL) 500 MG tablet Take 1 tablet (500 mg total) by mouth every 8 (eight) hours for 10 days. 30 tablet 0  ? aspirin EC 325 MG tablet Take 1 tablet (325 mg total) by mouth daily. 30 tablet 0  ? HYDROmorphone (DILAUDID) 2 MG tablet Take 1 tablet (2 mg total) by mouth every 4 (four) hours as needed for up to 5 days for severe pain. 30 tablet 0  ? ibuprofen (ADVIL) 800 MG tablet Take 1  tablet (800 mg total) by mouth every 8 (eight) hours for 10 days. Please take with food, please alternate with acetaminophen 30 tablet 0  ? ?No current facility-administered medications for this visit.  ? ?No results found. ? ?Review of Systems:   ?A ROS was performed including pertinent positives and negatives as documented in the HPI. ? ? ?Musculoskeletal Exam:   ? ?There were no vitals taken for this visit. ? ?Right posterior incision is well-healing no erythema or drainage.  Right elbow range of motion is from 0-120 without pain.  Right knee range of motion is from 0-90.  Again minimal pain.  Incision sites about the right lateral tibial plateau are well-healed.  Ex-Fix sites are clean dry intact.  There is some swelling about the ankle.  Sensation is intact distally in  all distributions.  2+ dorsalis pedis pulse.  Compartments are compressible.  No palpable cords in the back of the right calf ? ?Imaging:   ? ?None ? ?I personally reviewed and interpreted the radiographs. ? ? ?Assessment:   ?31 year old male 1 and half weeks status post right tibial plateau open reduction internal fixation as well as right humeral open reduction internal fixation, overall doing very well.  At this time he may range his right knee as tolerated as well as his right elbow.  I would still like him to be nonweightbearing on the right leg and do no lifting about the right elbow.  I will see him back in 2 weeks for recheck ? ?Plan :   ? ?-Return to clinic in 2 weeks for recheck ?-He will begin physical therapy at his home with the above restrictions ? ? ? ? ?I personally saw and evaluated the patient, and participated in the management and treatment plan. ? ?Huel Cote, MD ?Attending Physician, Orthopedic Surgery ? ?This document was dictated using Conservation officer, historic buildings. A reasonable attempt at proof reading has been made to minimize errors. ?

## 2021-12-27 NOTE — Addendum Note (Signed)
Addended by: Benancio Deeds on: 12/27/2021 09:45 AM ? ? Modules accepted: Orders ? ?

## 2022-01-01 ENCOUNTER — Other Ambulatory Visit (HOSPITAL_BASED_OUTPATIENT_CLINIC_OR_DEPARTMENT_OTHER): Payer: Self-pay | Admitting: Orthopaedic Surgery

## 2022-01-01 ENCOUNTER — Telehealth (HOSPITAL_BASED_OUTPATIENT_CLINIC_OR_DEPARTMENT_OTHER): Payer: Self-pay | Admitting: Orthopaedic Surgery

## 2022-01-01 MED ORDER — HYDROMORPHONE HCL 2 MG PO TABS: 2.0000 mg | ORAL_TABLET | ORAL | 0 refills | Status: AC | PRN

## 2022-01-01 MED ORDER — HYDROMORPHONE HCL 2 MG PO TABS: 2.0000 mg | ORAL_TABLET | ORAL | 0 refills | Status: DC | PRN

## 2022-01-01 NOTE — Telephone Encounter (Signed)
Rx request 

## 2022-01-03 ENCOUNTER — Encounter (HOSPITAL_BASED_OUTPATIENT_CLINIC_OR_DEPARTMENT_OTHER): Payer: Medicaid Other | Admitting: Orthopaedic Surgery

## 2022-01-04 ENCOUNTER — Encounter (HOSPITAL_BASED_OUTPATIENT_CLINIC_OR_DEPARTMENT_OTHER): Payer: Self-pay | Admitting: Orthopaedic Surgery

## 2022-01-04 ENCOUNTER — Other Ambulatory Visit (HOSPITAL_BASED_OUTPATIENT_CLINIC_OR_DEPARTMENT_OTHER): Payer: Self-pay | Admitting: Orthopaedic Surgery

## 2022-01-04 DIAGNOSIS — S42361A Displaced segmental fracture of shaft of humerus, right arm, initial encounter for closed fracture: Secondary | ICD-10-CM

## 2022-01-04 DIAGNOSIS — S82141A Displaced bicondylar fracture of right tibia, initial encounter for closed fracture: Secondary | ICD-10-CM

## 2022-01-10 ENCOUNTER — Other Ambulatory Visit (HOSPITAL_BASED_OUTPATIENT_CLINIC_OR_DEPARTMENT_OTHER): Payer: Self-pay | Admitting: Orthopaedic Surgery

## 2022-01-10 ENCOUNTER — Ambulatory Visit (INDEPENDENT_AMBULATORY_CARE_PROVIDER_SITE_OTHER): Payer: Medicaid Other | Admitting: Orthopaedic Surgery

## 2022-01-10 ENCOUNTER — Ambulatory Visit (HOSPITAL_BASED_OUTPATIENT_CLINIC_OR_DEPARTMENT_OTHER)
Admission: RE | Admit: 2022-01-10 | Discharge: 2022-01-10 | Disposition: A | Discharge status: Home / Self Care | Payer: Medicaid Other | Source: Ambulatory Visit | Attending: Orthopaedic Surgery | Admitting: Orthopaedic Surgery

## 2022-01-10 DIAGNOSIS — S42361A Displaced segmental fracture of shaft of humerus, right arm, initial encounter for closed fracture: Secondary | ICD-10-CM

## 2022-01-10 DIAGNOSIS — S82141A Displaced bicondylar fracture of right tibia, initial encounter for closed fracture: Secondary | ICD-10-CM

## 2022-01-10 NOTE — Progress Notes (Signed)
? ?                            ? ? ?Post Operative Evaluation ?  ?, ?Procedure/Date of Surgery: Right humeral open reduction internal fixation done 12/16/2021, right tibial plateau open reduction internal fixation 12/19/21 ? ?Interval History:  ? ? ?Michael Juarez presents today for his first follow-up after the above procedures.  Overall he is doing very well.  He is now able to lift the right arm overhead and move the elbow without difficulty.  He has been compliant with nonweightbearing on the right lower extremity.  He does still have some swelling in the ankle although he has no tenderness in the calf ? ? ?PMH/PSH/Family History/Social History/Meds/Allergies:   ?No past medical history on file. ?Past Surgical History:  ?Procedure Laterality Date  ? EXTERNAL FIXATION LEG Right 12/16/2021  ? Procedure: EXTERNAL FIXATION KNEE;  Surgeon: Huel Cote, MD;  Location: [facility];  Service: Orthopedics;  Laterality: Right;  ? EXTERNAL FIXATION REMOVAL Right 12/19/2021  ? Procedure: REMOVAL EXTERNAL FIXATION LEG;  Surgeon: Huel Cote, MD;  Location: [facility];  Service: Orthopedics;  Laterality: Right;  ? FRACTURE SURGERY    ? ORIF HUMERUS FRACTURE Right 12/16/2021  ? Procedure: OPEN REDUCTION INTERNAL FIXATION (ORIF) DISTAL HUMERUS FRACTURE;  Surgeon: Huel Cote, MD;  Location: [facility];  Service: Orthopedics;  Laterality: Right;  ? ORIF TIBIA PLATEAU Right 12/19/2021  ? Procedure: OPEN REDUCTION INTERNAL FIXATION (ORIF) TIBIAL PLATEAU;  Surgeon: Huel Cote, MD;  Location: [facility];  Service: Orthopedics;  Laterality: Right;  ? ?Social History  ? ?Socioeconomic History  ? Marital status: Single  ?  Spouse name: Not on file  ? Number of children: Not on file  ? Years of education: Not on file  ? Highest education level: Not on file  ?Occupational History  ? Not on file  ?Tobacco Use  ? Smoking status: Every Day  ?  Packs/day: 1.00  ?  Types: Cigarettes  ? Smokeless tobacco: Not on file  ?Substance and Sexual Activity  ? Alcohol  use: Yes  ?  Comment: 10 units on the weekends  ? Drug use: No  ? Sexual activity: Not on file  ?Other Topics Concern  ? Not on file  ?Social History Narrative  ? Not on file  ? ?Social Determinants of Health  ? ?Financial Resource Strain: Not on file  ?Food Insecurity: Not on file  ?Transportation Needs: Not on file  ?Physical Activity: Not on file  ?Stress: Not on file  ?Social Connections: Not on file  ? ?No family history on file. ?Allergies  ?Allergen Reactions  ? Amoxicillin Other (See Comments)  ?  Unsure of reaction, he was a child  ? ?Current Outpatient Medications  ?Medication Sig Dispense Refill  ? aspirin EC 325 MG tablet Take 1 tablet (325 mg total) by mouth daily. 30 tablet 0  ? ?No current facility-administered medications for this visit.  ? ?No results found. ? ?Review of Systems:   ?A ROS was performed including pertinent positives and negatives as documented in the HPI. ? ? ?Musculoskeletal Exam:   ? ?There were no vitals taken for this visit. ? ?Right posterior incision is well-healing no erythema or drainage.  Right elbow range of motion is from 0-120 without pain.  Right knee range of motion is from 0-90.  Again minimal pain.  Incision sites about the right lateral tibial plateau are well-healed.  Ex-Fix sites  are clean dry intact.  There is some swelling about the ankle.  Sensation is intact distally in all distributions.  2+ dorsalis pedis pulse.  Compartments are compressible.  No palpable cords in the back of the right calf ? ?Imaging:   ? ?None ? ?I personally reviewed and interpreted the radiographs. ? ? ?Assessment:   ?31 year old male who is 3 weeks status post right humerus open reduction internal fixation and right tibial plateau open reduction fixation.  At this time I would like him to undergo physical therapy for increasing range of motion about the right knee.  I do believe that specifically he could benefit from some additional work on his flexion.  I would still like him to  remain nonweightbearing for an additional 3 weeks.  He should remain without any type of heavy lifting greater than 2 pounds on the right arm ? ?Plan :   ? ?-Return to clinic in 3 weeks ? ? ? ? ?I personally saw and evaluated the patient, and participated in the management and treatment plan. ? ?Huel Cote, MD ?Attending Physician, Orthopedic Surgery ? ?This document was dictated using Conservation officer, historic buildings. A reasonable attempt at proof reading has been made to minimize errors. ?

## 2022-01-31 ENCOUNTER — Encounter (HOSPITAL_BASED_OUTPATIENT_CLINIC_OR_DEPARTMENT_OTHER): Payer: Medicaid Other | Admitting: Orthopaedic Surgery

## 2022-01-31 ENCOUNTER — Other Ambulatory Visit (HOSPITAL_BASED_OUTPATIENT_CLINIC_OR_DEPARTMENT_OTHER): Payer: Self-pay | Admitting: Orthopaedic Surgery

## 2022-01-31 DIAGNOSIS — S42401D Unspecified fracture of lower end of right humerus, subsequent encounter for fracture with routine healing: Secondary | ICD-10-CM

## 2022-01-31 DIAGNOSIS — S82141D Displaced bicondylar fracture of right tibia, subsequent encounter for closed fracture with routine healing: Secondary | ICD-10-CM

## 2022-03-07 ENCOUNTER — Ambulatory Visit (INDEPENDENT_AMBULATORY_CARE_PROVIDER_SITE_OTHER): Payer: Medicaid Other | Admitting: Orthopaedic Surgery

## 2022-03-07 ENCOUNTER — Ambulatory Visit (INDEPENDENT_AMBULATORY_CARE_PROVIDER_SITE_OTHER): Payer: Medicaid Other

## 2022-03-07 DIAGNOSIS — S82141D Displaced bicondylar fracture of right tibia, subsequent encounter for closed fracture with routine healing: Secondary | ICD-10-CM

## 2022-03-07 DIAGNOSIS — S42361A Displaced segmental fracture of shaft of humerus, right arm, initial encounter for closed fracture: Secondary | ICD-10-CM | POA: Diagnosis not present

## 2022-03-07 NOTE — Progress Notes (Signed)
Post Operative Evaluation   , Procedure/Date of Surgery: Right humeral open reduction internal fixation done 12/16/2021, right tibial plateau open reduction internal fixation 12/19/21  Interval History:    Michael Juarez presents today for his first follow-up after the above procedures.  Overall he is doing very well.  He is now weightbearing as tolerated on the right leg with minimal limp.  The right elbow he is back to using as tolerated.  He has no complaints at today's visit.   PMH/PSH/Family History/Social History/Meds/Allergies:   No past medical history on file. Past Surgical History:  Procedure Laterality Date   EXTERNAL FIXATION LEG Right 12/16/2021   Procedure: EXTERNAL FIXATION KNEE;  Surgeon: Huel Cote, MD;  Location: [facility];  Service: Orthopedics;  Laterality: Right;   EXTERNAL FIXATION REMOVAL Right 12/19/2021   Procedure: REMOVAL EXTERNAL FIXATION LEG;  Surgeon: Huel Cote, MD;  Location: [facility];  Service: Orthopedics;  Laterality: Right;   FRACTURE SURGERY     ORIF HUMERUS FRACTURE Right 12/16/2021   Procedure: OPEN REDUCTION INTERNAL FIXATION (ORIF) DISTAL HUMERUS FRACTURE;  Surgeon: Huel Cote, MD;  Location: [facility];  Service: Orthopedics;  Laterality: Right;   ORIF TIBIA PLATEAU Right 12/19/2021   Procedure: OPEN REDUCTION INTERNAL FIXATION (ORIF) TIBIAL PLATEAU;  Surgeon: Huel Cote, MD;  Location: [facility];  Service: Orthopedics;  Laterality: Right;   Social History   Socioeconomic History   Marital status: Single    Spouse name: Not on file   Number of children: Not on file   Years of education: Not on file   Highest education level: Not on file  Occupational History   Not on file  Tobacco Use   Smoking status: Every Day    Packs/day: 1.00    Types: Cigarettes   Smokeless tobacco: Not on file  Substance and Sexual Activity   Alcohol use: Yes    Comment: 10 units on the weekends   Drug use: No   Sexual activity: Not  on file  Other Topics Concern   Not on file  Social History Narrative   Not on file   Social Determinants of Health   Financial Resource Strain: Not on file  Food Insecurity: Not on file  Transportation Needs: Not on file  Physical Activity: Not on file  Stress: Not on file  Social Connections: Not on file   No family history on file. Allergies  Allergen Reactions   Amoxicillin Other (See Comments)    Unsure of reaction, he was a child   Current Outpatient Medications  Medication Sig Dispense Refill   aspirin EC 325 MG tablet Take 1 tablet (325 mg total) by mouth daily. 30 tablet 0   No current facility-administered medications for this visit.   No results found.  Review of Systems:   A ROS was performed including pertinent positives and negatives as documented in the HPI.   Musculoskeletal Exam:    There were no vitals taken for this visit.  Right arm and leg incisions are healed.  Right elbow range of motion is from 0-130 without pain.  Right knee range of motion is from 0-90.  Small bursal fluid about the right olecranon bursa.  Range of motion of the right knee is from 0 to 135 degrees sensation is intact distally in all distributions.  2+ dorsalis pedis  pulse.  Compartments are compressible.  No palpable cords in the back of the right calf  Imaging:    X-rays right tibia 2 views, right humerus 2 views: Significant callus formation about the humerus as well as proximal tibia  I personally reviewed and interpreted the radiographs.   Assessment:   31 year old male who is 11 weeks status post right humerus open reduction internal fixation as well as right tibial plateau.  Overall he is doing very well.  At this time I would like him to work with physical therapy for the right leg for strengthening.  He may use his right arm and right leg as tolerated at this point. Plan :    -Return to clinic in 8 weeks     I personally saw and evaluated the patient, and  participated in the management and treatment plan.  Huel Cote, MD Attending Physician, Orthopedic Surgery  This document was dictated using Dragon voice recognition software. A reasonable attempt at proof reading has been made to minimize errors.

## 2022-03-08 ENCOUNTER — Ambulatory Visit (HOSPITAL_BASED_OUTPATIENT_CLINIC_OR_DEPARTMENT_OTHER): Payer: Medicaid Other | Admitting: Orthopaedic Surgery

## 2022-05-02 ENCOUNTER — Ambulatory Visit (HOSPITAL_BASED_OUTPATIENT_CLINIC_OR_DEPARTMENT_OTHER): Payer: Medicaid Other | Admitting: Orthopaedic Surgery

## 2022-10-20 IMAGING — DX DG TIBIA/FIBULA 2V*R*
4 series · 4 of 4 positions shown · non-contrast
Comparison: Right tibia and fibula radiographs 01/10/2022

CLINICAL DATA: Postop film of closed fracture right tibial plateau.

EXAM:
RIGHT TIBIA AND FIBULA - 2 VIEW

[tibia ap (1 of 2)]
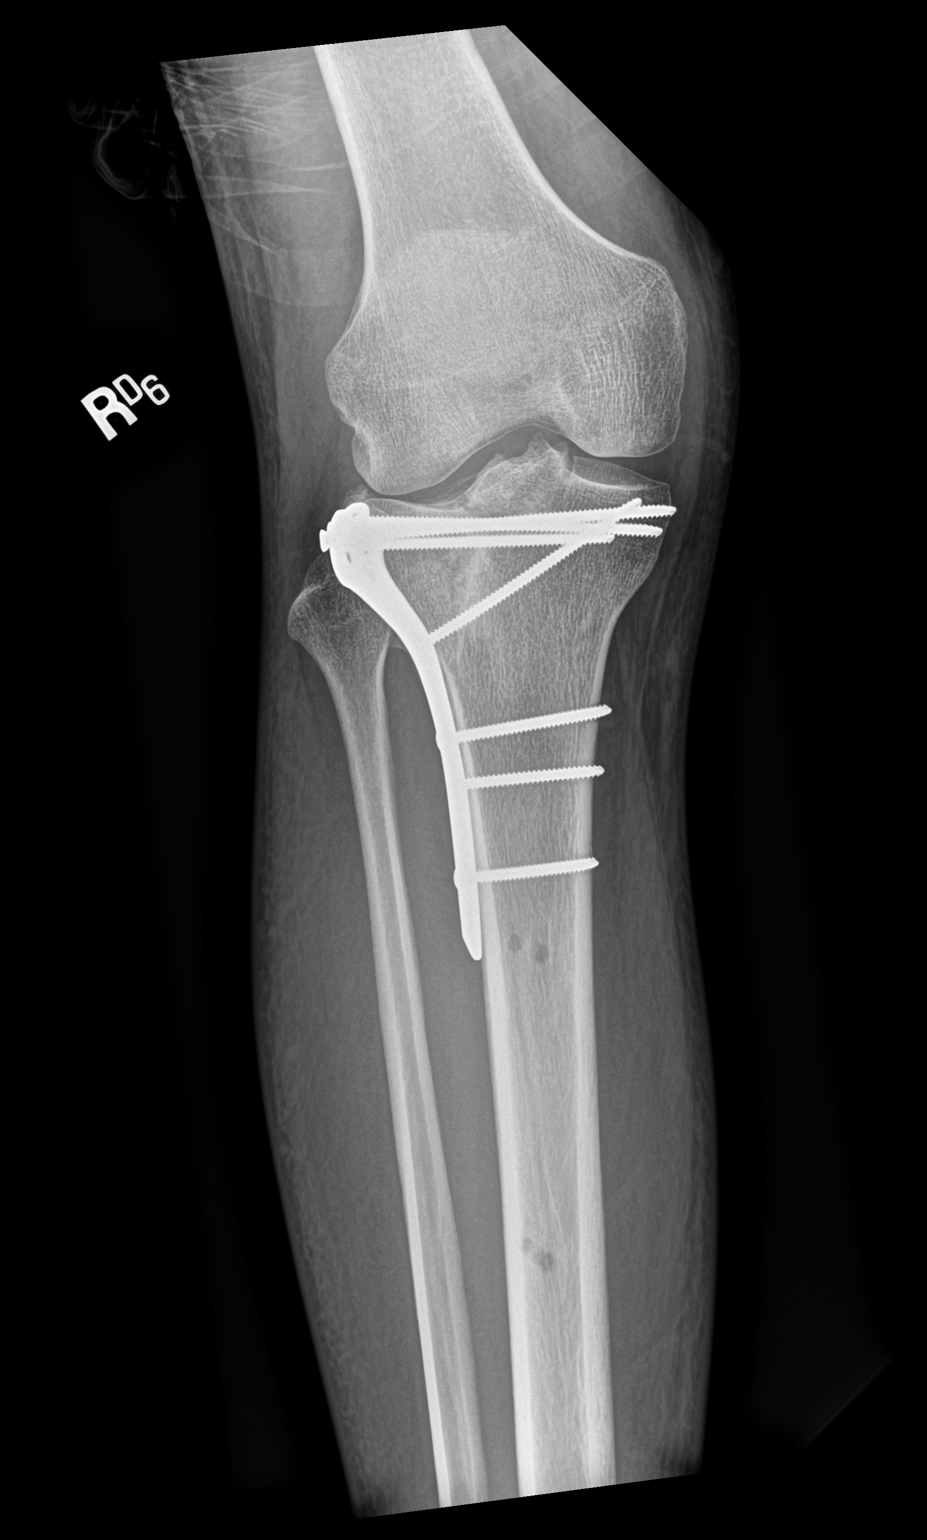

[tibia ap (2 of 2)]
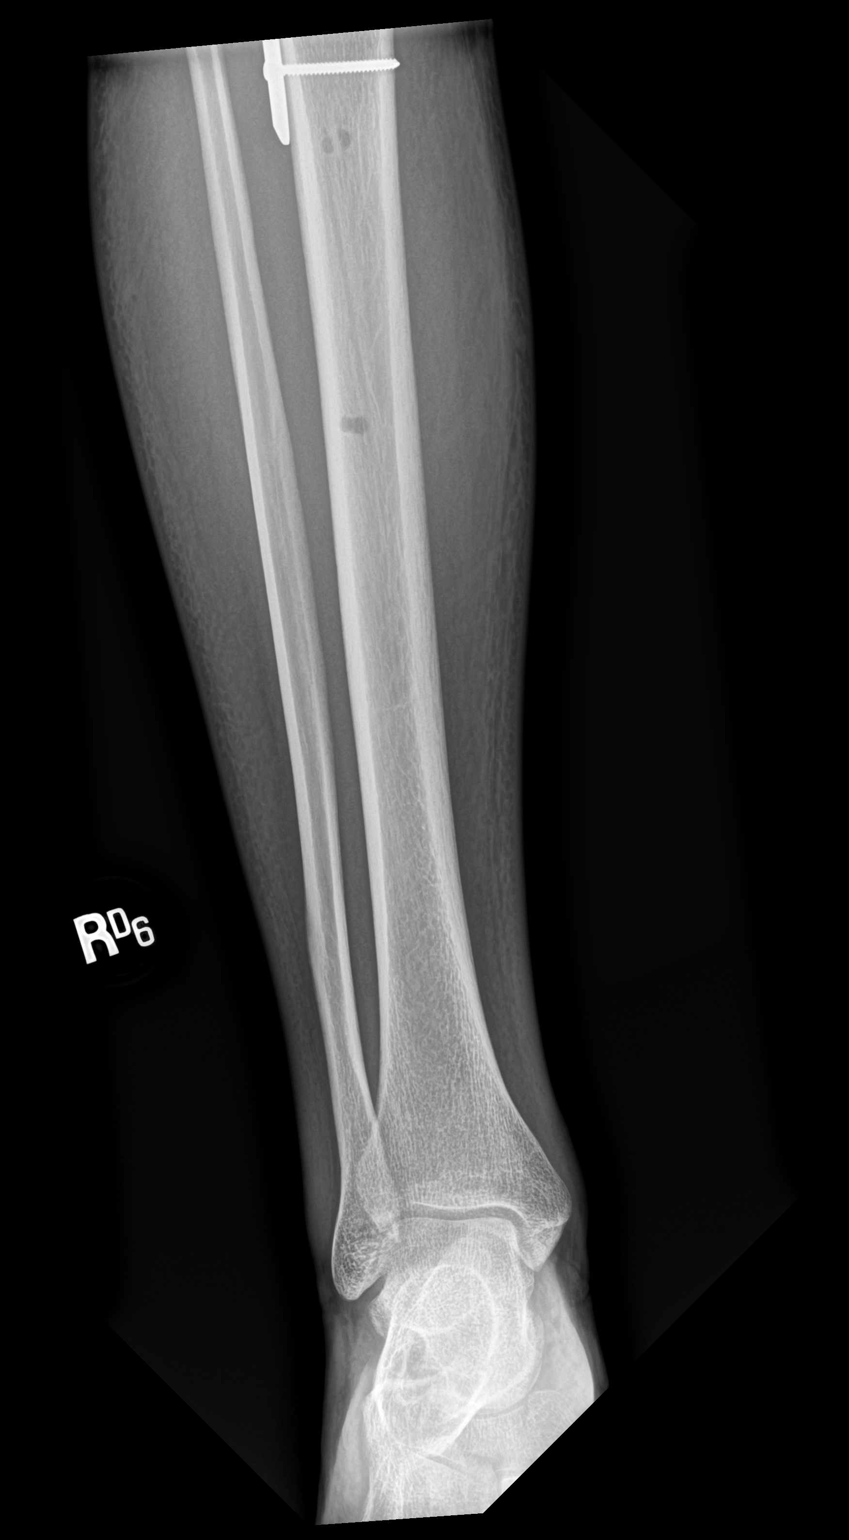

[tibia lat (1 of 2)]
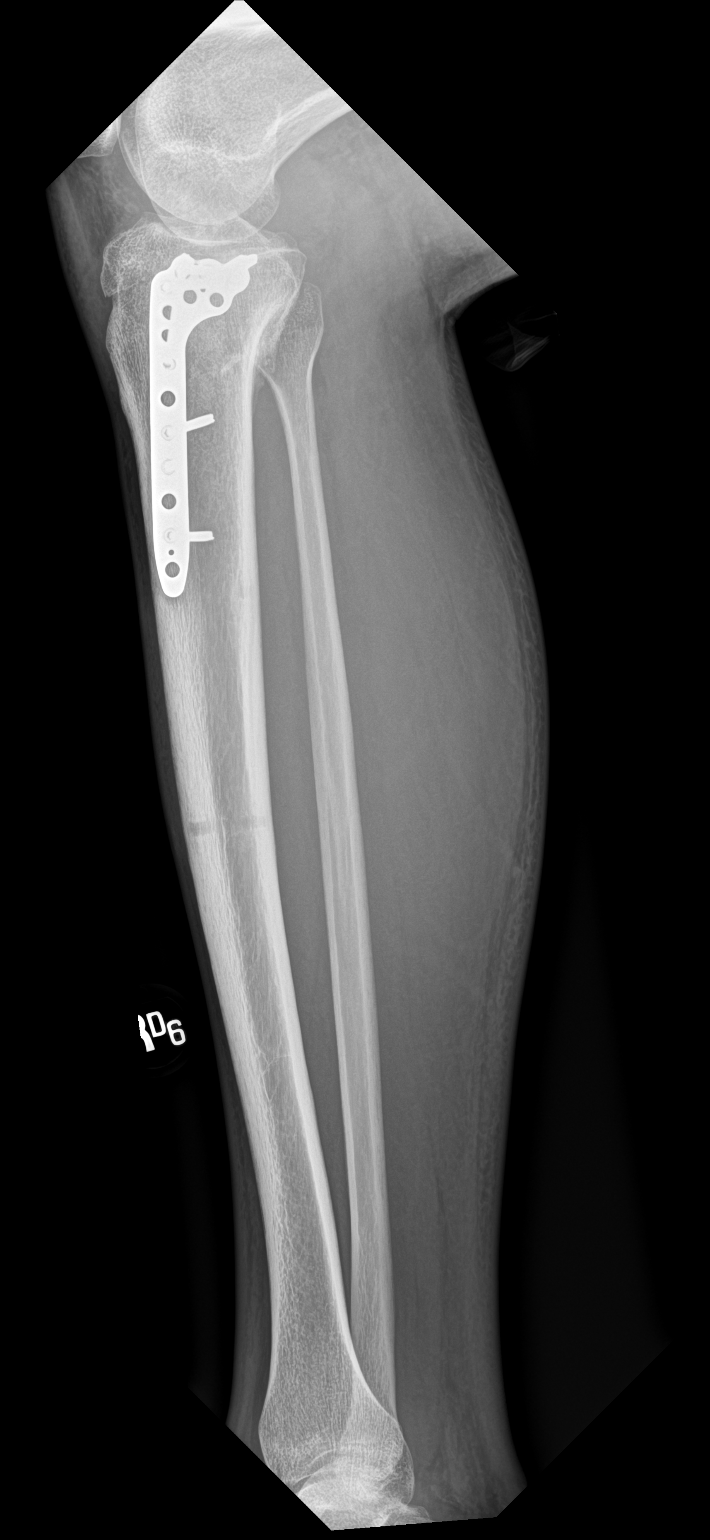

[tibia lat (2 of 2)]
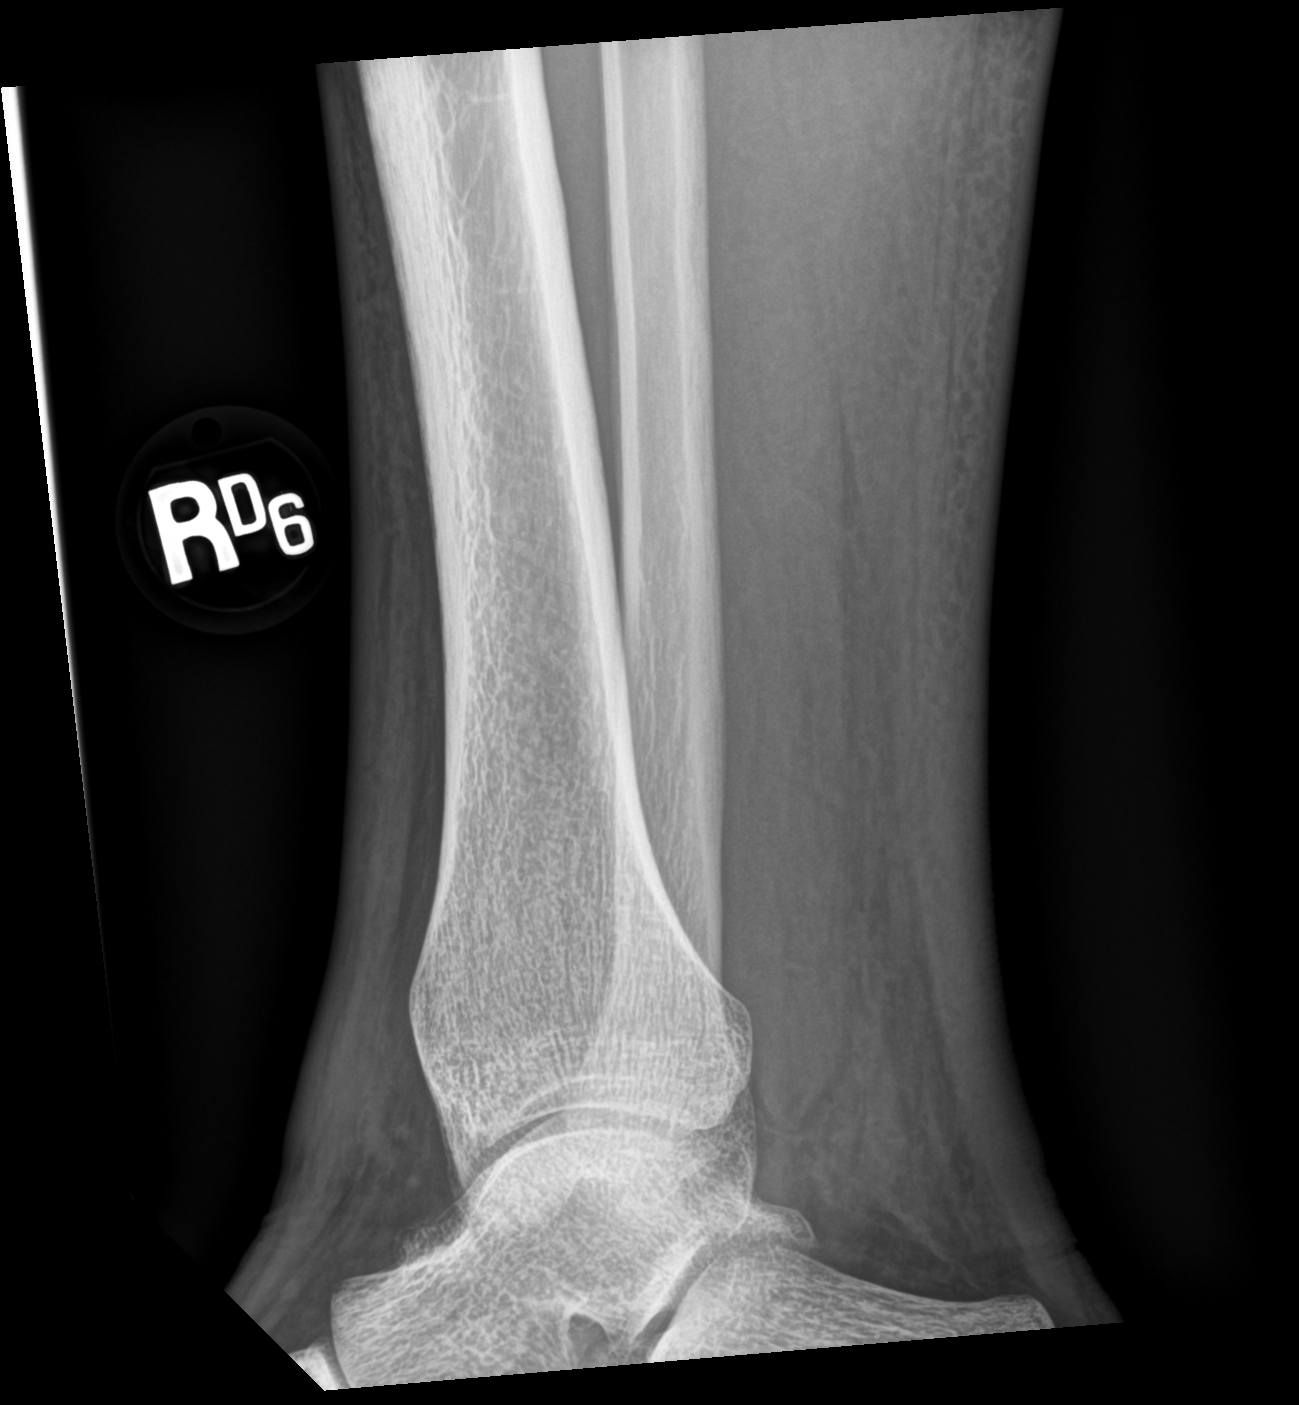

[4 of 4 positions shown; findings below may reference images not displayed]

FINDINGS: Redemonstration of lateral plate and screw fixation of comminuted
proximal lateral greater than medial tibial fracture. Unchanged near
anatomic alignment. Mild lateral tibial plateau concave articular
surface is unchanged. Progressive healing sclerosis across the
fracture lines. Persistent fracture line lucency within the medial
tibial spine and lateral tibial plateau extending through the
lateral proximal tibial metaphysis. There again ghost tracks within
the tibial shaft from prior external fixator placement.
IMPRESSION: Progressive healing sclerosis with persistent fracture line lucency
within the proximal tibia status post lateral plate and screw
fixation. Near anatomic alignment.

## 2023-02-06 ENCOUNTER — Ambulatory Visit (HOSPITAL_BASED_OUTPATIENT_CLINIC_OR_DEPARTMENT_OTHER): Payer: Self-pay

## 2023-02-06 ENCOUNTER — Encounter (HOSPITAL_BASED_OUTPATIENT_CLINIC_OR_DEPARTMENT_OTHER): Payer: Self-pay | Admitting: Orthopaedic Surgery

## 2023-02-06 ENCOUNTER — Ambulatory Visit (HOSPITAL_BASED_OUTPATIENT_CLINIC_OR_DEPARTMENT_OTHER): Payer: Self-pay | Admitting: Orthopaedic Surgery

## 2023-02-06 ENCOUNTER — Other Ambulatory Visit (HOSPITAL_BASED_OUTPATIENT_CLINIC_OR_DEPARTMENT_OTHER): Payer: Self-pay | Admitting: Orthopaedic Surgery

## 2023-02-06 ENCOUNTER — Ambulatory Visit (INDEPENDENT_AMBULATORY_CARE_PROVIDER_SITE_OTHER): Payer: Self-pay | Admitting: Orthopaedic Surgery

## 2023-02-06 DIAGNOSIS — S82141D Displaced bicondylar fracture of right tibia, subsequent encounter for closed fracture with routine healing: Secondary | ICD-10-CM

## 2023-02-06 NOTE — Progress Notes (Signed)
Post Operative Evaluation   , Procedure/Date of Surgery: Right humeral open reduction internal fixation done 12/16/2021, right tibial plateau open reduction internal fixation 12/19/21  Interval History:    Michael Juarez presents today for his first follow-up after the above procedures.  Unfortunately he has been having some persistent right lateral based knee pain.  He is here today for further discussion.  He is having difficulty with range of motion about the knee as well as swelling.   PMH/PSH/Family History/Social History/Meds/Allergies:   History reviewed. No pertinent past medical history. Past Surgical History:  Procedure Laterality Date   EXTERNAL FIXATION LEG Right 12/16/2021   Procedure: EXTERNAL FIXATION KNEE;  Surgeon: Huel Cote, MD;  Location: [facility];  Service: Orthopedics;  Laterality: Right;   EXTERNAL FIXATION REMOVAL Right 12/19/2021   Procedure: REMOVAL EXTERNAL FIXATION LEG;  Surgeon: Huel Cote, MD;  Location: [facility];  Service: Orthopedics;  Laterality: Right;   FRACTURE SURGERY     ORIF HUMERUS FRACTURE Right 12/16/2021   Procedure: OPEN REDUCTION INTERNAL FIXATION (ORIF) DISTAL HUMERUS FRACTURE;  Surgeon: Huel Cote, MD;  Location: [facility];  Service: Orthopedics;  Laterality: Right;   ORIF TIBIA PLATEAU Right 12/19/2021   Procedure: OPEN REDUCTION INTERNAL FIXATION (ORIF) TIBIAL PLATEAU;  Surgeon: Huel Cote, MD;  Location: [facility];  Service: Orthopedics;  Laterality: Right;   Social History   Socioeconomic History   Marital status: Significant Other    Spouse name: Not on file   Number of children: Not on file   Years of education: Not on file   Highest education level: Not on file  Occupational History   Not on file  Tobacco Use   Smoking status: Every Day    Packs/day: 1    Types: Cigarettes   Smokeless tobacco: Not on file  Substance and Sexual Activity   Alcohol use: Yes    Comment: 10 units on the weekends    Drug use: No   Sexual activity: Not on file  Other Topics Concern   Not on file  Social History Narrative   Not on file   Social Determinants of Health   Financial Resource Strain: Not on file  Food Insecurity: Not on file  Transportation Needs: Not on file  Physical Activity: Not on file  Stress: Not on file  Social Connections: Not on file   History reviewed. No pertinent family history. Allergies  Allergen Reactions   Amoxicillin Other (See Comments)    Unsure of reaction, he was a child   Current Outpatient Medications  Medication Sig Dispense Refill   aspirin EC 325 MG tablet Take 1 tablet (325 mg total) by mouth daily. 30 tablet 0   No current facility-administered medications for this visit.   No results found.  Review of Systems:   A ROS was performed including pertinent positives and negatives as documented in the HPI.   Musculoskeletal Exam:    There were no vitals taken for this visit.  Right arm and leg incisions are healed.  Right elbow range of motion is from 0-130 without pain.  Right knee range of motion is from 0-90.  Small bursal fluid about the right olecranon bursa.    Range of motion of the right knee is from 0 to 110  degrees sensation is intact distally in all distributions.  There is tenderness about the lateral joint line.  2+ dorsalis pedis pulse.  Compartments are compressible.  No palpable cords in the back of the right calf  Imaging:    X-rays right tibia 2 views, right humerus 2 views: Significant callus formation about the humerus as well as proximal tibia, there is question of possible proud screws involving the proximal tibia  I personally reviewed and interpreted the radiographs.   Assessment:   32 year old male who is status post right knee tibial plateau open reduction internal fixation.  At today's visit his x-rays were of a different projection.  There is a possibility of intra-articular penetration of the screws and given this  I do believe that we should proceed with removal of hardware given that the proximal tibia is healed.  I would recommend knee arthroscopy at this time as well so that I could perform an assessment of the knee to see if there is any significant arthritis that would potentially need to be intervened upon.  Given the fact that he has had persistent range of motion pain about the lateral knee I do believe that removal of hardware is indicated and would also help [place] to assess the status of the lateral knee Plan :    -Plan for right knee removal of hardware, right knee arthroscopy   After a lengthy discussion of treatment options, including risks, benefits, alternatives, complications of surgical and nonsurgical conservative options, the patient elected surgical repair.   The patient  is aware of the material risks  and complications including, but not limited to injury to adjacent structures, neurovascular injury, infection, numbness, bleeding, implant failure, thermal burns, stiffness, persistent pain, failure to heal, disease transmission from allograft, need for further surgery, dislocation, anesthetic risks, blood clots, risks of death,and others. The probabilities of surgical success and failure discussed with patient given their particular co-morbidities.The time and nature of expected rehabilitation and recovery was discussed.The patient's questions were all answered preoperatively.  No barriers to understanding were noted. I explained the natural history of the disease process and Rx rationale.  I explained to the patient what I considered to be reasonable expectations given their personal situation.  The final treatment plan was arrived at through a shared patient decision making process model.     I personally saw and evaluated the patient, and participated in the management and treatment plan.  Huel Cote, MD Attending Physician, Orthopedic Surgery  This document was dictated using  Dragon voice recognition software. A reasonable attempt at proof reading has been made to minimize errors.

## 2023-05-03 ENCOUNTER — Emergency Department (HOSPITAL_COMMUNITY)
Admission: EM | Admit: 2023-05-03 | Discharge: 2023-05-03 | Disposition: A | Discharge status: Home / Self Care | Payer: 59 | Attending: Emergency Medicine | Admitting: Emergency Medicine

## 2023-05-03 ENCOUNTER — Other Ambulatory Visit: Payer: Self-pay

## 2023-05-03 ENCOUNTER — Encounter (HOSPITAL_COMMUNITY): Payer: Self-pay | Admitting: *Deleted

## 2023-05-03 DIAGNOSIS — B084 Enteroviral vesicular stomatitis with exanthem: Secondary | ICD-10-CM | POA: Diagnosis not present

## 2023-05-03 DIAGNOSIS — R21 Rash and other nonspecific skin eruption: Secondary | ICD-10-CM | POA: Diagnosis present

## 2023-05-03 DIAGNOSIS — Z7982 Long term (current) use of aspirin: Secondary | ICD-10-CM | POA: Insufficient documentation

## 2023-05-03 NOTE — ED Triage Notes (Signed)
Presents with rash on hands, abd and feet started yesterday.

## 2023-05-03 NOTE — ED Provider Notes (Signed)
[place] [facility] AT [facility] Provider Note   CSN: [id number] Arrival date & time: 05/03/23  1032     History  Chief Complaint  Patient presents with   Rash    Michael Juarez is a 32 y.o. male who presents with concern for rash that started on his hands, feet, abdomen last night.  States the rash was initially nonpainful but now is more painful this morning.  Denies any itching.  Denies any fever or chills, any other symptoms.  Denies any new foods or medications, soaps, lotions, clothes.  Reports his child was sick this week they were unsure if he had a rash.  Denies any concern for STIs.   Rash      Home Medications Prior to Admission medications   Medication Sig Start Date End Date Taking? Authorizing Provider  aspirin EC 325 MG tablet Take 1 tablet (325 mg total) by mouth daily. 12/21/21   Huel Cote, MD      Allergies    Amoxicillin    Review of Systems   Review of Systems  Skin:  Positive for rash.    Physical Exam Updated Vital Signs BP 129/78 (BP Location: Left Arm)   Pulse 91   Temp 98.8 F (37.1 C) (Oral)   Resp 16   Ht 6' (1.829 m)   Wt 81.6 kg   SpO2 100%   BMI 24.41 kg/m  Physical Exam Vitals and nursing note reviewed.  Constitutional:      Appearance: Normal appearance.  HENT:     Head: Atraumatic.  Cardiovascular:     Rate and Rhythm: Normal rate and regular rhythm.  Pulmonary:     Effort: Pulmonary effort is normal.     Breath sounds: Normal breath sounds.  Abdominal:     General: Abdomen is flat.     Palpations: Abdomen is soft.  Skin:    Comments: Erythematous papules on the palm and dorsum of the hands bilaterally, feet bilaterally, lower abdomen  Neurological:     General: No focal deficit present.     Mental Status: He is alert.  Psychiatric:        Mood and Affect: Mood normal.        Behavior: Behavior normal.          ED Results / Procedures / Treatments   Labs (all labs ordered are  listed, but only abnormal results are displayed) Labs Reviewed - No data to display  EKG None  Radiology No results found.  Procedures Procedures    Medications Ordered in ED Medications - No data to display  ED Course/ Medical Decision Making/ A&P                             Medical Decision Making  32 y.o. male presents to the ED for concern of rash   Differential diagnosis includes but is not limited to contact dermatitis, hand-foot-and-mouth, syphilis  ED Course:  Patient developed rash that began yesterday on abdomen, hands, feet.  The rash is painful and is a erythematous papular type rash.  His child was also recently sick.  He denies any new foods, medications, clothing items, lotions, etc. that would be consistent with contact dermatitis. Denies any concerns for STIs, less concern for syphilis. Feel presentation is most consistent with hand-foot-and-mouth disease.  Patient counseled on using ibuprofen as needed for pain control and handwashing.  He understands this will resolve with  time.   Impression: Hand-foot-and-mouth disease  Disposition:  The patient was discharged home with instructions for pain control and preventing spread. Return precautions given.            Final Clinical Impression(s) / ED Diagnoses Final diagnoses:  Rash    Rx / DC Orders ED Discharge Orders     None         Arabella Merles, PA-C 05/03/23 1148    Arby Barrette, MD 05/20/23 1722

## 2023-05-03 NOTE — Discharge Instructions (Addendum)
You likely have hand, foot, and mouth disease.  Please continue to wash hands well and do not let other touch or rash to prevent spread.  This will begin to resolve on its own with time. It should begin to improve within the next week.   You may use up to 800mg  ibuprofen every 6 hours as needed for pain.  Return to the ER if you experience any fevers, chills, if your rash does not improve within the next 5 days, any other new or concerning symptoms.
